# Patient Record
Sex: Male | Born: 1985
Health system: Southern US, Community
[De-identification: ages and names within clinical notes are randomized; demographics above are authoritative.]

## PROBLEM LIST (undated history)

## (undated) DIAGNOSIS — R079 Chest pain, unspecified: Secondary | ICD-10-CM

## (undated) HISTORY — PX: KNEE SURGERY: SHX244

## (undated) HISTORY — DX: Chest pain, unspecified: R07.9

---

## 2017-10-24 ENCOUNTER — Ambulatory Visit (HOSPITAL_BASED_OUTPATIENT_CLINIC_OR_DEPARTMENT_OTHER)
Admission: RE | Admit: 2017-10-24 | Discharge: 2017-10-24 | Disposition: A | Discharge status: Home / Self Care | Payer: BLUE CROSS/BLUE SHIELD | Source: Ambulatory Visit | Attending: Emergency Medicine | Admitting: Emergency Medicine

## 2017-10-24 ENCOUNTER — Encounter (HOSPITAL_BASED_OUTPATIENT_CLINIC_OR_DEPARTMENT_OTHER): Payer: Self-pay

## 2017-10-24 ENCOUNTER — Encounter (HOSPITAL_BASED_OUTPATIENT_CLINIC_OR_DEPARTMENT_OTHER): Payer: Self-pay | Admitting: Emergency Medicine

## 2017-10-24 ENCOUNTER — Ambulatory Visit (HOSPITAL_BASED_OUTPATIENT_CLINIC_OR_DEPARTMENT_OTHER)
Admit: 2017-10-24 | Discharge: 2017-10-24 | Disposition: A | Discharge status: Home / Self Care | Payer: BLUE CROSS/BLUE SHIELD | Attending: Emergency Medicine | Admitting: Emergency Medicine

## 2017-10-24 ENCOUNTER — Emergency Department (HOSPITAL_BASED_OUTPATIENT_CLINIC_OR_DEPARTMENT_OTHER)
Admission: EM | Admit: 2017-10-24 | Discharge: 2017-10-24 | Disposition: A | Discharge status: Home / Self Care | Payer: BLUE CROSS/BLUE SHIELD | Attending: Emergency Medicine | Admitting: Emergency Medicine

## 2017-10-24 ENCOUNTER — Other Ambulatory Visit: Payer: Self-pay

## 2017-10-24 DIAGNOSIS — N503 Cyst of epididymis: Secondary | ICD-10-CM | POA: Insufficient documentation

## 2017-10-24 DIAGNOSIS — N433 Hydrocele, unspecified: Secondary | ICD-10-CM | POA: Insufficient documentation

## 2017-10-24 DIAGNOSIS — I861 Scrotal varices: Secondary | ICD-10-CM

## 2017-10-24 DIAGNOSIS — N50812 Left testicular pain: Secondary | ICD-10-CM | POA: Insufficient documentation

## 2017-10-24 LAB — URINALYSIS, ROUTINE W REFLEX MICROSCOPIC
Bilirubin Urine: NEGATIVE
Glucose, UA: NEGATIVE mg/dL
KETONES UR: NEGATIVE mg/dL
LEUKOCYTES UA: NEGATIVE
NITRITE: NEGATIVE
PROTEIN: NEGATIVE mg/dL
Specific Gravity, Urine: >1.03 — ABNORMAL HIGH (ref 1.005–1.030)
pH: 6 (ref 5.0–8.0)

## 2017-10-24 LAB — URINALYSIS, MICROSCOPIC (REFLEX)

## 2017-10-24 MED ORDER — IBUPROFEN 600 MG PO TABS: 600.0000 mg | ORAL_TABLET | Freq: Four times a day (QID) | ORAL | 0 refills | Status: DC | PRN

## 2017-10-24 MED ORDER — IBUPROFEN 800 MG PO TABS
800.0000 mg | ORAL_TABLET | Freq: Once | ORAL | Status: AC
  Administered 2017-10-24: 800 mg via ORAL
  Filled 2017-10-24: qty 1

## 2017-10-24 NOTE — Discharge Instructions (Signed)
You were seen today for testicle pain.  STD testing is pending.  I suspect you may have epididymitis which is inflammation of the testis.  However, return later today for an ultrasound.  If you develop acute worsening pain or any new or worsening symptoms you should be reevaluated.

## 2017-10-24 NOTE — ED Provider Notes (Signed)
[facility] EMERGENCY DEPARTMENT Provider Note   CSN: [id number] Arrival date & time: 10/24/17  0200     History   Chief Complaint Chief Complaint  Patient presents with  . Testicle Pain    HPI Joseph Flynn is a 32 y.o. male.  HPI  This is a 32 year old male who presents with left testicle pain.  Patient reports pain in the left testicle over the last week.  He reports that it is tender to touch.  No masses noted.  No penile discharge or dysuria noted.  He denies any new sexual partner; however, he does not consistently use condoms.  Additionally he states that he has some left lower back pain the pain does not radiate.  Current pain is 2 out of 10.  He reports that the pain is dull and sometimes comes and goes based on positioning.  Denies any hematuria or history of kidney stones.  History reviewed. No pertinent past medical history.  There are no active problems to display for this patient.   Past Surgical History:  Procedure Laterality Date  . KNEE SURGERY         Home Medications    Prior to Admission medications   Medication Sig Start Date End Date Taking? Authorizing Provider  ibuprofen (ADVIL,MOTRIN) 600 MG tablet Take 1 tablet (600 mg total) by mouth every 6 (six) hours as needed. 10/24/17   Horton, Barbette Hair, MD    Family History No family history on file.  Social History Social History   Tobacco Use  . Smoking status: Never Smoker  . Smokeless tobacco: Never Used  Substance Use Topics  . Alcohol use: Yes    Comment: "frequently up until a couple weeks ago"  . Drug use: No     Allergies   Patient has no known allergies.   Review of Systems Review of Systems  Constitutional: Negative for fever.  Genitourinary: Positive for testicular pain. Negative for discharge, dysuria and scrotal swelling.  Musculoskeletal: Positive for back pain.  All other systems reviewed and are negative.    Physical Exam Updated Vital Signs BP 124/82  (BP Location: Left Arm)   Pulse 79   Temp 98.8 F (37.1 C) (Oral)   Resp 16   Ht 5\' 8"  (1.727 m)   Wt 86.2 kg (190 lb)   SpO2 99%   BMI 28.89 kg/m   Physical Exam  Constitutional: He is oriented to person, place, and time. He appears well-developed and well-nourished. No distress.  HENT:  Head: Normocephalic and atraumatic.  Cardiovascular: Normal rate, regular rhythm and normal heart sounds.  Pulmonary/Chest: Effort normal and breath sounds normal. No respiratory distress. He has no wheezes.  Abdominal: Soft. There is no tenderness.  Genitourinary:  Genitourinary Comments: Normal circumcised penis, testicle without mass noted, no significant tenderness to palpation, normal testicular lie with intact cremasteric reflex bilaterally No CVA tenderness  Musculoskeletal: He exhibits no edema.  Neurological: He is alert and oriented to person, place, and time.  Skin: Skin is warm and dry.  Psychiatric: He has a normal mood and affect.  Nursing note and vitals reviewed.    ED Treatments / Results  Labs (all labs ordered are listed, but only abnormal results are displayed) Labs Reviewed  URINALYSIS, ROUTINE W REFLEX MICROSCOPIC - Abnormal; Notable for the following components:      Result Value   Specific Gravity, Urine >1.030 (*)    Hgb urine dipstick MODERATE (*)    All other components within normal  limits  URINALYSIS, MICROSCOPIC (REFLEX) - Abnormal; Notable for the following components:   Bacteria, UA FEW (*)    Squamous Epithelial / LPF 0-5 (*)    All other components within normal limits  GC/CHLAMYDIA PROBE AMP ([place]) NOT AT [facility]    EKG  EKG Interpretation None       Radiology No results found.  Procedures Procedures (including critical care time)  Medications Ordered in ED Medications  ibuprofen (ADVIL,MOTRIN) tablet 800 mg (800 mg Oral Given 10/24/17 0233)     Initial Impression / Assessment and Plan / ED Course  I have reviewed the triage vital  signs and the nursing notes.  Pertinent labs & imaging results that were available during my care of the patient were reviewed by me and considered in my medical decision making (see chart for details).     Patient presents with testicle and back pain for 1 week.  Does report prior episode probably 3 months ago.  Patient overall nontoxic and his vital signs are reassuring.  His physical exam is benign.  He has no tenderness on my exam but reports tenderness to touch previously.  Consider STD, epididymitis.  Less likely torsion given normal exam and intact cremasteric reflex.  He is not concerned for STDs.  However, STD testing sent.  Urinalysis is largely reassuring.  Patient is very comfortable.  Kidney stone and epididymitis are considerations; however, given how comfortable the patient appears, would elect to treat supportively.  Recommend supportive underwear and ibuprofen as needed for discomfort.  Ultrasound of the testicle was ordered for later today.  Do not feel he needs acute ultrasound at this time as I have low suspicion for acute torsion.  After history, exam, and medical workup I feel the patient has been appropriately medically screened and is safe for discharge home. Pertinent diagnoses were discussed with the patient. Patient was given return precautions.   Final Clinical Impressions(s) / ED Diagnoses   Final diagnoses:  Pain in left testicle    ED Discharge Orders        Ordered    ibuprofen (ADVIL,MOTRIN) 600 MG tablet  Every 6 hours PRN     10/24/17 0248    US PELVIC DOPPLER (TORSION R/O OR MASS ARTERIAL FLOW)     10/24/17 0248    US Scrotum     10/24/17 0248       Horton, Barbette Hair, MD 10/24/17 [phone]

## 2017-10-24 NOTE — ED Triage Notes (Signed)
Reports L testicle pain x ~1 wk ago, with tenderness to touch. Also reports L flank pain. Denies swelling, states " I think it may have gotten a little smaller". Denies dysuria, penile discharge or other sx.

## 2017-10-24 NOTE — ED Provider Notes (Signed)
Patient was seen on 11/14 at 0200 with complaints of left testicular pain that is been ongoing for 1 week.  Low concern for STD.  Patient was seen by provider who ordered outpatient ultrasound of scrotum rule out torsion or any epididymitis.  ED today for ultrasound.  Ultrasound tech gave me report and asked me to update patient on findings.  I spoke with patient concerning the findings.  He does have a cyst arising from both epididymis.  No signs of testicular torsion, inflammatory processes.  He does have a minimal hydrocele on the left and a small varices on the right.  Have given patient the findings and instruct him to follow-up with urology if the symptoms persist.  He is asked about his gonorrhea and chlamydia which I told him are still pending at this time.  Return precautions of any pain worsens, he develops fevers, penile discharge or testicular needed patient verbalized understanding with all plan of care.  Have instructed to follow-up with [facility].  Pt is hemodynamically stable, in NAD, & able to ambulate in the ED. Evaluation does not show pathology that would require ongoing emergent intervention or inpatient treatment. I explained the diagnosis to the patient. Pain has been managed & has no complaints prior to dc. Pt is comfortable with above plan and is stable for discharge at this time. All questions were answered prior to disposition. Strict return precautions for f/u to the ED were discussed. Encouraged follow up with PCP.    Doristine Devoid, PA-C 10/24/17 1036    Duffy Bruce, MD 10/25/17 [phone]

## 2017-10-25 LAB — GC/CHLAMYDIA PROBE AMP (~~LOC~~) NOT AT ARMC
Chlamydia: NEGATIVE
NEISSERIA GONORRHEA: NEGATIVE

## 2020-05-31 ENCOUNTER — Encounter (HOSPITAL_COMMUNITY): Payer: Self-pay

## 2020-05-31 ENCOUNTER — Emergency Department (HOSPITAL_COMMUNITY): Payer: Managed Care, Other (non HMO)

## 2020-05-31 ENCOUNTER — Other Ambulatory Visit: Payer: Self-pay

## 2020-05-31 ENCOUNTER — Emergency Department (HOSPITAL_COMMUNITY)
Admission: EM | Admit: 2020-05-31 | Discharge: 2020-06-01 | Disposition: A | Discharge status: Home / Self Care | Payer: Managed Care, Other (non HMO) | Attending: Emergency Medicine | Admitting: Emergency Medicine

## 2020-05-31 DIAGNOSIS — Z5321 Procedure and treatment not carried out due to patient leaving prior to being seen by health care provider: Secondary | ICD-10-CM | POA: Insufficient documentation

## 2020-05-31 DIAGNOSIS — R0789 Other chest pain: Secondary | ICD-10-CM | POA: Insufficient documentation

## 2020-05-31 LAB — BASIC METABOLIC PANEL
Anion gap: 8 (ref 5–15)
BUN: 7 mg/dL (ref 6–20)
CO2: 28 mmol/L (ref 22–32)
Calcium: 9.7 mg/dL (ref 8.9–10.3)
Chloride: 102 mmol/L (ref 98–111)
Creatinine, Ser: 1.09 mg/dL (ref 0.61–1.24)
GFR calc Af Amer: >60 mL/min (ref >60)
GFR calc non Af Amer: >60 mL/min (ref >60)
Glucose, Bld: 95 mg/dL (ref 70–99)
Potassium: 4.3 mmol/L (ref 3.5–5.1)
Sodium: 138 mmol/L (ref 135–145)

## 2020-05-31 LAB — CBC
HCT: 47.7 % (ref 39.0–52.0)
Hemoglobin: 15.9 g/dL (ref 13.0–17.0)
MCH: 30 pg (ref 26.0–34.0)
MCHC: 33.3 g/dL (ref 30.0–36.0)
MCV: 90 fL (ref 80.0–100.0)
Platelets: 232 10*3/uL (ref 150–400)
RBC: 5.3 MIL/uL (ref 4.22–5.81)
RDW: 12 % (ref 11.5–15.5)
WBC: 5.8 10*3/uL (ref 4.0–10.5)
nRBC: 0 % (ref 0.0–0.2)

## 2020-05-31 LAB — TROPONIN I (HIGH SENSITIVITY)
Troponin I (High Sensitivity): <2 ng/L (ref <18)
Troponin I (High Sensitivity): <2 ng/L (ref <18)

## 2020-05-31 NOTE — ED Triage Notes (Signed)
Patient complains of right sided chest pain since Tuesday, describes as tightness, no cough, no other complaints. States all started following Covid vaccine on Monday

## 2020-06-01 ENCOUNTER — Other Ambulatory Visit: Payer: Self-pay

## 2020-06-01 ENCOUNTER — Emergency Department (HOSPITAL_COMMUNITY)
Admission: EM | Admit: 2020-06-01 | Discharge: 2020-06-02 | Disposition: A | Discharge status: Home / Self Care | Payer: Managed Care, Other (non HMO) | Source: Home / Self Care | Attending: Emergency Medicine | Admitting: Emergency Medicine

## 2020-06-01 ENCOUNTER — Encounter (HOSPITAL_COMMUNITY): Payer: Self-pay | Admitting: Emergency Medicine

## 2020-06-01 ENCOUNTER — Emergency Department (HOSPITAL_COMMUNITY): Payer: Managed Care, Other (non HMO)

## 2020-06-01 DIAGNOSIS — R0789 Other chest pain: Secondary | ICD-10-CM | POA: Insufficient documentation

## 2020-06-01 DIAGNOSIS — R079 Chest pain, unspecified: Secondary | ICD-10-CM

## 2020-06-01 NOTE — ED Notes (Signed)
Pt stated that he saw his lab results and was going to follow up with his pcp.

## 2020-06-01 NOTE — ED Triage Notes (Signed)
Patient reports mid/right chest pain onset Wednesday last week , no SOB , denies emesis or diaphoresis .

## 2020-06-02 LAB — BASIC METABOLIC PANEL
Anion gap: 8 (ref 5–15)
BUN: 12 mg/dL (ref 6–20)
CO2: 27 mmol/L (ref 22–32)
Calcium: 9.5 mg/dL (ref 8.9–10.3)
Chloride: 104 mmol/L (ref 98–111)
Creatinine, Ser: 1.04 mg/dL (ref 0.61–1.24)
GFR calc Af Amer: >60 mL/min (ref >60)
GFR calc non Af Amer: >60 mL/min (ref >60)
Glucose, Bld: 107 mg/dL — ABNORMAL HIGH (ref 70–99)
Potassium: 3.7 mmol/L (ref 3.5–5.1)
Sodium: 139 mmol/L (ref 135–145)

## 2020-06-02 LAB — TROPONIN I (HIGH SENSITIVITY)
Troponin I (High Sensitivity): <2 ng/L (ref <18)
Troponin I (High Sensitivity): 3 ng/L (ref <18)

## 2020-06-02 LAB — PROTIME-INR
INR: 1 (ref 0.8–1.2)
Prothrombin Time: 12.4 seconds (ref 11.4–15.2)

## 2020-06-02 LAB — CBC
HCT: 46.7 % (ref 39.0–52.0)
Hemoglobin: 15.4 g/dL (ref 13.0–17.0)
MCH: 29.6 pg (ref 26.0–34.0)
MCHC: 33 g/dL (ref 30.0–36.0)
MCV: 89.8 fL (ref 80.0–100.0)
Platelets: 227 10*3/uL (ref 150–400)
RBC: 5.2 MIL/uL (ref 4.22–5.81)
RDW: 12.1 % (ref 11.5–15.5)
WBC: 6.2 10*3/uL (ref 4.0–10.5)
nRBC: 0 % (ref 0.0–0.2)

## 2020-06-02 LAB — D-DIMER, QUANTITATIVE: D-Dimer, Quant: <0.27 ug/mL-FEU (ref 0.00–0.50)

## 2020-06-02 NOTE — ED Notes (Signed)
Patient verbalizes understanding of discharge instructions. Opportunity for questioning and answers were provided. Armband removed by staff, pt discharged from ED and ambulated to lobby to return home.   

## 2020-06-02 NOTE — Discharge Instructions (Signed)
If you develop recurrent, continued, or worsening chest pain, shortness of breath, fever, vomiting, abdominal or back pain, or any other new/concerning symptoms then return to the ER for evaluation.  

## 2020-06-02 NOTE — ED Provider Notes (Signed)
[facility] EMERGENCY DEPARTMENT Provider Note   CSN: [id number] Arrival date & time: 06/01/20  2202     History Chief Complaint  Patient presents with  . Chest Pain    Joseph Flynn is a 34 y.o. male.  HPI 34 year old male presents with chest pain.  Started on 8/19.  It is more of a tingling across his chest but he also feels pain in the right side of his chest.  Was pretty constant up until yesterday and now it is better but still present.  No fevers, cough, dyspnea.  Had some back numbness 2 days prior to the chest symptoms and 1 day after receiving his first Covid vaccine.  He did recently travel to and from [place] via car.  He has not noticed any leg swelling.  The pain is mild to moderate right now.  He was originally here on 8/21 and had labs and ECG.  Came back again because he was told his ECG was abnormal by his relative who spoke to a doctor.  History reviewed. No pertinent past medical history.  There are no problems to display for this patient.   Past Surgical History:  Procedure Laterality Date  . KNEE SURGERY Left    x 2 (2013, 2017)       No family history on file.  Social History   Tobacco Use  . Smoking status: Never Smoker  . Smokeless tobacco: Never Used  Vaping Use  . Vaping Use: Never used  Substance Use Topics  . Alcohol use: Yes    Comment: "frequently up until a couple weeks ago"  . Drug use: No    Home Medications Prior to Admission medications   Medication Sig Start Date End Date Taking? Authorizing Provider  ibuprofen (ADVIL) 200 MG tablet Take 200-400 mg by mouth every 6 (six) hours as needed for headache or mild pain.   Yes [provider]  ibuprofen (ADVIL,MOTRIN) 600 MG tablet Take 1 tablet (600 mg total) by mouth every 6 (six) hours as needed. Patient not taking: Reported on 06/02/2020 10/24/17   Horton, Barbette Hair, MD    Allergies    Patient has no known allergies.  Review of Systems   Review of  Systems  Constitutional: Negative for fever.  Respiratory: Negative for cough and shortness of breath.   Cardiovascular: Positive for chest pain.  Gastrointestinal: Negative for abdominal pain.  Musculoskeletal: Negative for back pain.  All other systems reviewed and are negative.   Physical Exam Updated Vital Signs BP 121/72 (BP Location: Right Arm)   Pulse 83   Temp 98.9 F (37.2 C) (Oral)   Resp 14   Ht 5\' 8"  (1.727 m)   Wt 90 kg   SpO2 100%   BMI 30.17 kg/m   Physical Exam Vitals and nursing note reviewed.  Constitutional:      General: He is not in acute distress.    Appearance: He is well-developed. He is not ill-appearing or diaphoretic.  HENT:     Head: Normocephalic and atraumatic.     Right Ear: External ear normal.     Left Ear: External ear normal.     Nose: Nose normal.  Eyes:     General:        Right eye: No discharge.        Left eye: No discharge.  Cardiovascular:     Rate and Rhythm: Normal rate and regular rhythm.     Heart sounds: Normal heart sounds.  Pulmonary:     Effort: Pulmonary effort is normal.     Breath sounds: Normal breath sounds.  Chest:     Chest wall: No tenderness.  Abdominal:     Palpations: Abdomen is soft.     Tenderness: There is no abdominal tenderness.  Musculoskeletal:     Cervical back: Neck supple.  Skin:    General: Skin is warm and dry.  Neurological:     Mental Status: He is alert.  Psychiatric:        Mood and Affect: Mood is not anxious.     ED Results / Procedures / Treatments   Labs (all labs ordered are listed, but only abnormal results are displayed) Labs Reviewed  BASIC METABOLIC PANEL - Abnormal; Notable for the following components:      Result Value   Glucose, Bld 107 (*)    All other components within normal limits  CBC  PROTIME-INR  D-DIMER, QUANTITATIVE (NOT AT [facility])  TROPONIN I (HIGH SENSITIVITY)  TROPONIN I (HIGH SENSITIVITY)    EKG EKG Interpretation  Date/Time:  Sunday June 01 2020 22:22:06 EDT Ventricular Rate:  82 PR Interval:  166 QRS Duration: 88 QT Interval:  346 QTC Calculation: 404 R Axis:   89 Text Interpretation: Normal sinus rhythm T wave abnormality, consider inferolateral ischemia Abnormal ECG T wave changes similar to May 31 2020 Confirmed by Sherwood Gambler [phone]) on 06/02/2020 2:42:26 PM   Radiology DG Chest 2 View  Result Date: 06/01/2020 CLINICAL DATA:  Chest pain EXAM: CHEST - 2 VIEW COMPARISON:  05/31/2020 FINDINGS: The heart size and mediastinal contours are within normal limits. Both lungs are clear. The visualized skeletal structures are unremarkable. IMPRESSION: No active cardiopulmonary disease. Electronically Signed   By: Randa Ngo M.D.   On: 06/01/2020 23:09   DG Chest 2 View  Result Date: 05/31/2020 CLINICAL DATA:  Acute chest pain. EXAM: CHEST - 2 VIEW COMPARISON:  None. FINDINGS: The cardiomediastinal silhouette is unremarkable. There is no evidence of focal airspace disease, pulmonary edema, suspicious pulmonary nodule/mass, pleural effusion, or pneumothorax. No acute bony abnormalities are identified. IMPRESSION: No active cardiopulmonary disease. Electronically Signed   By: Margarette Canada M.D.   On: 05/31/2020 18:50    Procedures Procedures (including critical care time)  Medications Ordered in ED Medications - No data to display  ED Course  I have reviewed the triage vital signs and the nursing notes.  Pertinent labs & imaging results that were available during my care of the patient were reviewed by me and considered in my medical decision making (see chart for details).    MDM Rules/Calculators/A&P                          Patient's ECG, labs, x-ray have been reviewed.  These are overall unremarkable besides the T wave inversion, which I think is benign T wave inversion.  Given recent trip and low level tachycardia on his first visit, D-dimer sent for otherwise low risk chest pain and is negative.  I think PE, ACS,  dissection are quite unlikely.  He is asking for a cardiology referral which will be given.  He will also search out a PCP.  Otherwise, he appears stable for discharge. Final Clinical Impression(s) / ED Diagnoses Final diagnoses:  Nonspecific chest pain    Rx / DC Orders ED Discharge Orders    None       Sherwood Gambler, MD 06/02/20 [phone]

## 2020-07-07 ENCOUNTER — Other Ambulatory Visit: Payer: Self-pay

## 2020-07-07 ENCOUNTER — Ambulatory Visit (INDEPENDENT_AMBULATORY_CARE_PROVIDER_SITE_OTHER): Payer: Managed Care, Other (non HMO) | Admitting: Cardiology

## 2020-07-07 ENCOUNTER — Encounter: Payer: Self-pay | Admitting: Cardiology

## 2020-07-07 VITALS — BP 120/71 | HR 78 | Temp 96.8°F | Ht 68.0 in | Wt 190.8 lb

## 2020-07-07 DIAGNOSIS — R9431 Abnormal electrocardiogram [ECG] [EKG]: Secondary | ICD-10-CM

## 2020-07-07 DIAGNOSIS — Z7189 Other specified counseling: Secondary | ICD-10-CM | POA: Diagnosis not present

## 2020-07-07 DIAGNOSIS — R079 Chest pain, unspecified: Secondary | ICD-10-CM | POA: Diagnosis not present

## 2020-07-07 NOTE — Progress Notes (Signed)
Cardiology Office Note:    Date:  07/07/2020   ID:  Joseph Flynn, DOB [birth year], MRN [id number]  PCP:  Patient, No Pcp Per  Cardiologist:  Buford Dresser, MD  Referring MD: Dr. Regenia Skeeter, ER  CC: new patient consultation for chest discomfort and abnormal ECG  History of Present Illness:    Joseph Flynn is a 34 y.o. male without significant past medical history who is seen as a new consult at the request of Dr. Regenia Skeeter, ER for the evaluation and management of chest pain and abnormal ECG.  Note reviewed from Dr. Regenia Skeeter dated 06/02/20. Reported 4 days of tingling across his chest and right sided chest discomfort. Per note, " He was originally here on 8/21 and had labs and ECG.  Came back again because he was told his ECG was abnormal by his relative who spoke to a doctor." HsTn and d-dimer negative. Patient requested cardiology referral.  Chest pain: -Initial onset: First onset was just after his brother passed away from Covid. First and only time that he has had discomfort. Got his covid shot 8/16, noted "back was on fire" and diffuse tingling sensation for about a week. After that week, settled as tingling on the right side of his chest. -Quality: tinlging -Frequency/duration: constant  -Associated symptoms: none -Aggravating/alleviating factors: none -Prior cardiac history: none -Prior workup: none -Prior treatment: none -Alcohol: none since 11/2019 -Tobacco: never -Comorbidities:  None. -Exercise level: had been jogging 3 miles several times/week -Cardiac ROS: no shortness of breath, no PND, no orthopnea, no LE edema, no syncope -Family history: none that he is aware of. Mother may have had a mini-stroke in 2013.  Does not yet have a primary care doctor.  History reviewed. No pertinent past medical history.  Past Surgical History:  Procedure Laterality Date  . KNEE SURGERY Left    x 2 (2013, 2017)    Current Medications: Current Outpatient Medications on File  Prior to Visit  Medication Sig  . ibuprofen (ADVIL) 200 MG tablet Take 200-400 mg by mouth every 6 (six) hours as needed for headache or mild pain.   No current facility-administered medications on file prior to visit.     Allergies:   Patient has no known allergies.   Social History   Tobacco Use  . Smoking status: Never Smoker  . Smokeless tobacco: Never Used  Vaping Use  . Vaping Use: Never used  Substance Use Topics  . Alcohol use: Yes    Comment: "frequently up until a couple weeks ago"  . Drug use: No    Family History: No history of cardiovascular disease that he is aware of. Mother may have had a mini-stroke in 2013.  ROS:   Please see the history of present illness.  Additional pertinent ROS: Constitutional: Negative for chills, fever, night sweats, unintentional weight loss  HENT: Negative for ear pain and hearing loss.   Eyes: Negative for loss of vision and eye pain.  Respiratory: Negative for cough, sputum, wheezing.   Cardiovascular: See HPI. Gastrointestinal: Negative for abdominal pain, melena, and hematochezia.  Genitourinary: Negative for dysuria and hematuria.  Musculoskeletal: Negative for falls and myalgias.  Skin: Negative for itching and rash.  Neurological: Negative for focal weakness, focal sensory changes and loss of consciousness.  Endo/Heme/Allergies: Does not bruise/bleed easily.     EKGs/Labs/Other Studies Reviewed:    The following studies were reviewed today: No prior  EKG:  EKG is personally reviewed.  The ekg ordered today demonstrates sinus rhythm with sinus  arrhythma, nonspecific T wave pattern  Recent Labs: 06/01/2020: BUN 12; Creatinine, Ser 1.04; Hemoglobin 15.4; Platelets 227; Potassium 3.7; Sodium 139  Recent Lipid Panel No results found for: CHOL, TRIG, HDL, CHOLHDL, VLDL, LDLCALC, LDLDIRECT  Physical Exam:    VS:  BP 120/71   Pulse 78   Temp (!) 96.8 F (36 C)   Ht 5\' 8"  (1.727 m)   Wt 190 lb 12.8 oz (86.5 kg)    SpO2 98%   BMI 29.01 kg/m     Wt Readings from Last 3 Encounters:  07/07/20 190 lb 12.8 oz (86.5 kg)  06/01/20 198 lb 6.6 oz (90 kg)  10/24/17 190 lb (86.2 kg)    GEN: Well nourished, well developed in no acute distress HEENT: Normal, moist mucous membranes NECK: No JVD CARDIAC: regular rhythm, normal S1 and S2, no rubs or gallops. No murmurs. VASCULAR: Radial and DP pulses 2+ bilaterally. No carotid bruits RESPIRATORY:  Clear to auscultation without rales, wheezing or rhonchi  ABDOMEN: Soft, non-tender, non-distended MUSCULOSKELETAL:  Ambulates independently SKIN: Warm and dry, no edema NEUROLOGIC:  Alert and oriented x 3. No focal neuro deficits noted. PSYCHIATRIC:  Normal affect    ASSESSMENT:    1. Chest pain, unspecified type   2. Abnormal ECG   3. Cardiac risk counseling   4. Counseling on health promotion and disease prevention    PLAN:    Chest pain: given symptoms, risk factors, low suspicion for cardiac etiology -negative ER workup -symptoms now resolved -no indication for further testing at this time -counseled on red flag warning signs that need immediate medical attention  Abnormal ECG:  -given age, nonspecific T wave pattern, not an elevated risk pattern  Cardiac risk counseling and prevention recommendations: -recommend heart healthy/Mediterranean diet, with whole grains, fruits, vegetable, fish, lean meats, nuts, and olive oil. Limit salt. -recommend moderate walking, 3-5 times/week for 30-50 minutes each session. Aim for at least 150 minutes.week. Goal should be pace of 3 miles/hours, or walking 1.5 miles in 30 minutes -recommend avoidance of tobacco products. Avoid excess alcohol. -ASCVD risk score: The ASCVD Risk score Mikey Bussing DC Jr., et al., 2013) failed to calculate for the following reasons:   The 2013 ASCVD risk score is only valid for ages 71 to 66    Plan for follow up: as needed. Recommended to establish with primary care  Buford Dresser, MD, PhD [place]  [facility]    Medication Adjustments/Labs and Tests Ordered: Current medicines are reviewed at length with the patient today.  Concerns regarding medicines are outlined above.  Orders Placed This Encounter  Procedures  . EKG 12-Lead   No orders of the defined types were placed in this encounter.   Patient Instructions  Medication Instructions:  Your Physician recommend you continue on your current medication as directed.    *If you need a refill on your cardiac medications before your next appointment, please call your pharmacy*   Lab Work: None ordered    Testing/Procedures: None ordered    Follow-Up: At [facility], you and your health needs are our priority.  As part of our continuing mission to provide you with exceptional heart care, we have created designated Provider Care Teams.  These Care Teams include your primary Cardiologist (physician) and Advanced Practice Providers (APPs -  Physician Assistants and Nurse Practitioners) who all work together to provide you with the care you need, when you need it.  We recommend signing up for the patient portal called "MyChart".  Sign up information is provided on this After Visit Summary.  MyChart is used to connect with patients for Virtual Visits (Telemedicine).  Patients are able to view lab/test results, encounter notes, upcoming appointments, etc.  Non-urgent messages can be sent to your provider as well.   To learn more about what you can do with MyChart, go to [email or web address].    Your next appointment:   As needed  The format for your next appointment:   In Person  Provider:   Buford Dresser, MD       Signed, Buford Dresser, MD PhD 07/07/2020 6:20 PM    [place]

## 2020-07-07 NOTE — Patient Instructions (Signed)
Medication Instructions:  Your Physician recommend you continue on your current medication as directed.    *If you need a refill on your cardiac medications before your next appointment, please call your pharmacy*   Lab Work: None ordered   Testing/Procedures: None ordered    Follow-Up: At [facility], you and your health needs are our priority.  As part of our continuing mission to provide you with exceptional heart care, we have created designated Provider Care Teams.  These Care Teams include your primary Cardiologist (physician) and Advanced Practice Providers (APPs -  Physician Assistants and Nurse Practitioners) who all work together to provide you with the care you need, when you need it.  We recommend signing up for the patient portal called "MyChart".  Sign up information is provided on this After Visit Summary.  MyChart is used to connect with patients for Virtual Visits (Telemedicine).  Patients are able to view lab/test results, encounter notes, upcoming appointments, etc.  Non-urgent messages can be sent to your provider as well.   To learn more about what you can do with MyChart, go to [email or web address].    Your next appointment:   As needed  The format for your next appointment:   In Person  Provider:   Bridgette Christopher, MD    

## 2020-12-23 ENCOUNTER — Other Ambulatory Visit: Payer: Self-pay

## 2020-12-24 ENCOUNTER — Ambulatory Visit (INDEPENDENT_AMBULATORY_CARE_PROVIDER_SITE_OTHER): Payer: Managed Care, Other (non HMO) | Admitting: Medical

## 2020-12-24 ENCOUNTER — Encounter: Payer: Self-pay | Admitting: Medical

## 2020-12-24 ENCOUNTER — Other Ambulatory Visit: Payer: Self-pay

## 2020-12-24 ENCOUNTER — Ambulatory Visit (HOSPITAL_BASED_OUTPATIENT_CLINIC_OR_DEPARTMENT_OTHER)
Admission: RE | Admit: 2020-12-24 | Discharge: 2020-12-24 | Disposition: A | Discharge status: Home / Self Care | Payer: Managed Care, Other (non HMO) | Source: Ambulatory Visit | Attending: Medical | Admitting: Medical

## 2020-12-24 VITALS — BP 138/80 | HR 98 | Temp 98.0°F | Resp 18 | Ht 68.0 in | Wt 194.2 lb

## 2020-12-24 DIAGNOSIS — R002 Palpitations: Secondary | ICD-10-CM

## 2020-12-24 DIAGNOSIS — M5441 Lumbago with sciatica, right side: Secondary | ICD-10-CM | POA: Diagnosis not present

## 2020-12-24 DIAGNOSIS — Z113 Encounter for screening for infections with a predominantly sexual mode of transmission: Secondary | ICD-10-CM | POA: Diagnosis not present

## 2020-12-24 DIAGNOSIS — R1032 Left lower quadrant pain: Secondary | ICD-10-CM

## 2020-12-24 DIAGNOSIS — N503 Cyst of epididymis: Secondary | ICD-10-CM

## 2020-12-24 DIAGNOSIS — Z87898 Personal history of other specified conditions: Secondary | ICD-10-CM | POA: Diagnosis not present

## 2020-12-24 DIAGNOSIS — G8929 Other chronic pain: Secondary | ICD-10-CM | POA: Diagnosis present

## 2020-12-24 NOTE — Progress Notes (Signed)
Subjective:    Patient ID: Joseph Flynn, male    DOB: 03-12-1986, 35 y.o.   MRN: [id number]  HPI  Pt in for first time.  Pt works [employer]. They do software for commercial vehicle diagnostics. Pt does not exercise regularly. Used to work out regularly past summer. Weight and jogging but stopped. No alcohol since feb 2021. Nonsmoker.   No regular meds.  Hx of chest pain in sept 2021.   Chest pain: given symptoms, risk factors, low suspicion for cardiac etiology -negative ER workup -symptoms now resolved -no indication for further testing at this time -counseled on red flag warning signs that need immediate medical attention  Abnormal ECG:  -given age, nonspecific T wave pattern, not an elevated risk pattern  Cardiac risk counseling and prevention recommendations: -recommend heart healthy/Mediterranean diet, with whole grains, fruits, vegetable, fish, lean meats, nuts, and olive oil. Limit salt. -recommend moderate walking, 3-5 times/week for 30-50 minutes each session. Aim for at least 150 minutes.week. Goal should be pace of 3 miles/hours, or walking 1.5 miles in 30 minutes -recommend avoidance of tobacco products. Avoid excess alcohol.   No recent recurrent chest pain as when went to ED. Pt feels like heart might palpitate at time. But also notes correlation of possible symptoms with eating fried foods.   Pt states some lower back pain on and off for multiple years. In august coming back from [place] had sore back pain. A day or so after he got back from trip had radicular rt lower ext pain. Patient still has some back and rt si area pain. No pain shooting to leg. But he states rt 5th toe has felt numb since august when had radicular pain.   Pt also mentions in October after driving long time had cramping left lower quadrant pain. He had diarrhea that day. Over next 2 weeks pain subsided. But still residual faint tender. No family hx of crohn or ulcerative colitis. At one time  back then he thought at times had mucus in stool. Normal bowel movements presently.  Pt wants std screening. He states some faint discomfort in his urethra. States when touched penis last week bottom asepct had pain. No we resolved. No testicle tenderness.  Pt thinks abnormal shape to testicle. In 11-2017 US showed.  IMPRESSION: 1. There is a cyst arising from the head of the epididymis on the right measuring 1.4 x 0.8 x 1.6 cm. There is a rather minimal 4 mm cyst arising from the head of the epididymis on the left.  2. No intratesticular mass on either side. No testicular torsion on either side. No inflammatory focus in either scrotal sac.  3. Rather minimal hydrocele on the left. No hydrocele on the right.  4.  Small varicocele on the right.   Review of Systems  Constitutional: Negative for chills, fatigue and fever.  HENT: Negative for congestion.   Respiratory: Negative for cough, choking, chest tightness, shortness of breath and wheezing.   Cardiovascular: Negative for chest pain and palpitations.  Gastrointestinal: Negative for abdominal pain, constipation, diarrhea and vomiting.  Genitourinary: Negative for difficulty urinating, dysuria, frequency, hematuria, penile pain and urgency.       See hpi.  Musculoskeletal: Negative for back pain, myalgias and neck stiffness.  Skin: Negative for rash.  Neurological: Negative for dizziness and headaches.  Hematological: Negative for adenopathy. Does not bruise/bleed easily.  Psychiatric/Behavioral: Negative for behavioral problems and confusion. The patient is not nervous/anxious.    No past medical history on  file.   Social History   Socioeconomic History  . Marital status: Single    Spouse name: Not on file  . Number of children: Not on file  . Years of education: Not on file  . Highest education level: Not on file  Occupational History  . Not on file  Tobacco Use  . Smoking status: Never Smoker  . Smokeless tobacco:  Never Used  Vaping Use  . Vaping Use: Never used  Substance and Sexual Activity  . Alcohol use: Not Currently  . Drug use: No  . Sexual activity: Not on file  Other Topics Concern  . Not on file  Social History Narrative  . Not on file   Social Determinants of Health   Financial Resource Strain: Not on file  Food Insecurity: Not on file  Transportation Needs: Not on file  Physical Activity: Not on file  Stress: Not on file  Social Connections: Not on file  Intimate Partner Violence: Not on file    Past Surgical History:  Procedure Laterality Date  . KNEE SURGERY Left    x 2 (2013, 2017)    History reviewed. No pertinent family history.  No Known Allergies  Current Outpatient Medications on File Prior to Visit  Medication Sig Dispense Refill  . ibuprofen (ADVIL) 200 MG tablet Take 200-400 mg by mouth every 6 (six) hours as needed for headache or mild pain.     No current facility-administered medications on file prior to visit.    BP 138/80 (BP Location: Left Arm, Patient Position: Sitting, Cuff Size: Normal)   Pulse 98   Temp 98 F (36.7 C) (Oral)   Resp 18   Ht 5\' 8"  (1.727 m)   Wt 194 lb 3.2 oz (88.1 kg)   SpO2 98%   BMI 29.53 kg/m       Objective:   Physical Exam  General Mental Status- Alert. General Appearance- Not in acute distress.   Skin General: Color- Normal Color. Moisture- Normal Moisture.  Neck Carotid Arteries- Normal color. Moisture- Normal Moisture. No carotid bruits. No JVD.  Chest and Lung Exam Auscultation: Breath Sounds:-Normal.  Cardiovascular Auscultation:Rythm- Regular. Murmurs & Other Heart Sounds:Auscultation of the heart reveals- No Murmurs.  Abdomen Inspection:-Inspeection Normal. Palpation/Percussion:Note:No mass. Palpation and Percussion of the abdomen reveal- faint left lower quadrant Tender, Non Distended + BS, no rebound or guarding.    Neurologic Cranial Nerve exam:- CN III-XII intact(No nystagmus),  symmetric smile. Strength:- 5/5 equal and symmetric strength both upper and lower extremities.   Back Mid lumbar spine tenderness to palpation. Pain on straight leg lift. Pain on lateral movements and flexion/extension of the spine.  Lower ext neurologic  L5-S1 sensation intact bilaterally. Normal patellar reflexes bilaterally. No foot drop bilaterally.    Assessment & Plan:  You have history of low back pain since September with some radicular features and right toe numbness sensation.  We will go ahead and get lumbar spine x-ray today.  Pain is not significant presently so not recommending treatment at this time.  If your pain does increase then would recommend ibuprofen 400 mg every 8 hours.  History of intermittent left lower quadrant abdomen pain since the fall.  Minimally present on physical exam today.  Will get CBC, CMP and lipase.  If your pain worsens or changes let us know.  Want to review labs first and then decide on possible referral to GI MD.  History of chest pain in the past with negative work-up per cardiologist.  I did review prior cardiologist note.  Now you are reporting more palpitation type symptoms at times.  We did EKG today.  Ekg machine not working today.  Recommend avoid caffeine beverages or any stimulants.  Will go ahead and refer you to cardiologist. If any cardiac symptoms during interim then advise ED evaluation.  Desire for STD panel screening.  Urine ancillary order placed.  We will also include HIV screen.  History of right side cyst on epididymis.  No pain presently reported.  We will go ahead and refer you to urologist for evaluation and treatment.  If he has any pain in testicles performed appointment with urologist notify me and would reorder ultrasound.  Also considering that you might have some atypical reflux symptoms as you report your palpitation symptoms occur after eating.  Recommend eating healthy diet and could try Pepcid  over-the-counter.   Follow-up in 2 weeks or as needed. Time spent with patient today was   minutes which consisted of chart revdiew, discussing diagnosis, work up treatment and documentation.  Time spent with patient today was 48  minutes which consisted of chart review, discussing diagnoses, work up treatment and documentation.

## 2020-12-24 NOTE — Patient Instructions (Addendum)
You have history of low back pain since September with some radicular features and right toe numbness sensation.  We will go ahead and get lumbar spine x-ray today.  Pain is not significant presently so not recommending treatment at this time.  If your pain does increase then would recommend ibuprofen 400 mg every 8 hours.  History of intermittent left lower quadrant abdomen pain since the fall.  Minimally present on physical exam today.  Will get CBC, CMP and lipase.  If your pain worsens or changes let us know.  Want to review labs first and then decide on possible referral to GI MD.  History of chest pain in the past with negative work-up per cardiologist.  I did review prior cardiologist note.  Now you are reporting more palpitation type symptoms at times.  We did EKG today.  Ekg machine not working today.  Recommend avoid caffeine beverages or any stimulants.  Will go ahead and refer you to cardiologist. If any significant cardiac symptoms during interim then advise ED evaluation.  Desire for STD panel screening.  Urine ancillary order placed.  We will also include HIV screen.  History of right side cyst on epididymis.  No pain presently reported.  We will go ahead and refer you to urologist for evaluation and treatment.  If he has any pain in testicles performed appointment with urologist notify me and would reorder ultrasound.  Also considering that you might have some atypical reflux symptoms as you report your palpitation symptoms occur after eating.  Recommend eating healthy diet and could try Pepcid over-the-counter.   Follow-up in 2 weeks or as needed.

## 2020-12-25 LAB — CBC WITH DIFFERENTIAL/PLATELET
Basophils Absolute: 0.1 10*3/uL (ref 0.0–0.1)
Basophils Relative: 0.9 % (ref 0.0–3.0)
Eosinophils Absolute: 0.1 10*3/uL (ref 0.0–0.7)
Eosinophils Relative: 1.1 % (ref 0.0–5.0)
HCT: 46.9 % (ref 39.0–52.0)
Hemoglobin: 16 g/dL (ref 13.0–17.0)
Lymphocytes Relative: 29 % (ref 12.0–46.0)
Lymphs Abs: 2 10*3/uL (ref 0.7–4.0)
MCHC: 34.2 g/dL (ref 30.0–36.0)
MCV: 88.1 fl (ref 78.0–100.0)
Monocytes Absolute: 0.7 10*3/uL (ref 0.1–1.0)
Monocytes Relative: 10.7 % (ref 3.0–12.0)
Neutro Abs: 4 10*3/uL (ref 1.4–7.7)
Neutrophils Relative %: 58.3 % (ref 43.0–77.0)
Platelets: 247 10*3/uL (ref 150.0–400.0)
RBC: 5.32 Mil/uL (ref 4.22–5.81)
RDW: 13 % (ref 11.5–15.5)
WBC: 6.9 10*3/uL (ref 4.0–10.5)

## 2020-12-25 LAB — COMPREHENSIVE METABOLIC PANEL
ALT: 47 U/L (ref 0–53)
AST: 30 U/L (ref 0–37)
Albumin: 4.5 g/dL (ref 3.5–5.2)
Alkaline Phosphatase: 90 U/L (ref 39–117)
BUN: 12 mg/dL (ref 6–23)
CO2: 31 mEq/L (ref 19–32)
Calcium: 9.7 mg/dL (ref 8.4–10.5)
Chloride: 100 mEq/L (ref 96–112)
Creatinine, Ser: 1.11 mg/dL (ref 0.40–1.50)
GFR: 87.99 mL/min (ref >60.00)
Glucose, Bld: 76 mg/dL (ref 70–99)
Potassium: 4.2 mEq/L (ref 3.5–5.1)
Sodium: 139 mEq/L (ref 135–145)
Total Bilirubin: 0.8 mg/dL (ref 0.2–1.2)
Total Protein: 7.6 g/dL (ref 6.0–8.3)

## 2020-12-25 LAB — LIPASE: Lipase: 42 U/L (ref 11.0–59.0)

## 2020-12-25 LAB — HIV ANTIBODY (ROUTINE TESTING W REFLEX): HIV 1&2 Ab, 4th Generation: NONREACTIVE

## 2021-01-05 ENCOUNTER — Ambulatory Visit (INDEPENDENT_AMBULATORY_CARE_PROVIDER_SITE_OTHER): Payer: Managed Care, Other (non HMO) | Admitting: Medical

## 2021-01-05 ENCOUNTER — Other Ambulatory Visit: Payer: Self-pay

## 2021-01-05 VITALS — BP 116/68 | HR 81 | Resp 20 | Ht 68.0 in | Wt 192.6 lb

## 2021-01-05 DIAGNOSIS — G8929 Other chronic pain: Secondary | ICD-10-CM

## 2021-01-05 DIAGNOSIS — N503 Cyst of epididymis: Secondary | ICD-10-CM

## 2021-01-05 DIAGNOSIS — M5441 Lumbago with sciatica, right side: Secondary | ICD-10-CM | POA: Diagnosis not present

## 2021-01-05 DIAGNOSIS — R002 Palpitations: Secondary | ICD-10-CM

## 2021-01-05 DIAGNOSIS — R1032 Left lower quadrant pain: Secondary | ICD-10-CM

## 2021-01-05 NOTE — Patient Instructions (Signed)
For your history of right sciatic region pain with persisting right fifth toe numbness we will go ahead and refer to sports medicine.  X-ray did show some degenerative changes to the lower spine.  We will see if was meant recommends MRI?  For history of persisting left lower quadrant tenderness on palpation I decided to go ahead and refer you to GI MD.  I think this is worthwhile in light of the fact that pain persists since October.  Lab work was negative.  History of palpitations with appointment to cardiology upcoming soon.  Reminder to avoid caffeine in diet.  History of testicle discomfort but no signs/symptoms presently.  Please keep urology appointment.  Follow-up date to be determined after review of specialist visit notes.

## 2021-01-05 NOTE — Progress Notes (Signed)
Subjective:    Patient ID: Joseph Flynn, male    DOB: 06/29/1986, 35 y.o.   MRN: [id number]  HPI Pt in for follow up. Last A/P below in ".  "You have history of low back pain since September with some radicular features and right toe numbness sensation.  We will go ahead and get lumbar spine x-ray today.  Pain is not significant presently so not recommending treatment at this time.  If your pain does increase then would recommend ibuprofen 400 mg every 8 hours.  History of intermittent left lower quadrant abdomen pain since the fall.  Minimally present on physical exam today.  Will get CBC, CMP and lipase.  If your pain worsens or changes let us know.  Want to review labs first and then decide on possible referral to GI MD.  History of chest pain in the past with negative work-up per cardiologist.  I did review prior cardiologist note.  Now you are reporting more palpitation type symptoms at times.  We did EKG today.  Ekg machine not working today.  Recommend avoid caffeine beverages or any stimulants.  Will go ahead and refer you to cardiologist. If any cardiac symptoms during interim then advise ED evaluation.  Desire for STD panel screening.  Urine ancillary order placed.  We will also include HIV screen.  History of right side cyst on epididymis.  No pain presently reported.  We will go ahead and refer you to urologist for evaluation and treatment.  If he has any pain in testicles performed appointment with urologist notify me and would reorder ultrasound.  Also considering that you might have some atypical reflux symptoms as you report your palpitation symptoms occur after eating.  Recommend eating healthy diet and could try Pepcid over-the-counter."   Pt back pain is not present. Pt did see xray report.  "IMPRESSION: Degenerative disc disease, worst at L5-S1."  No recent pain. Still having numbness to pinkey toe since August.   Regarding pain in abdomen. Still mild tender to  touch in left lower quadrant. Lab work negative initially. Pain only on palpation. No reoccurence with prior features as in Oct.  Pt has been called by cardiology. He has appointment April 7th approximate. Pt states he thought he had light brief plapitation yesterday. No current signs or symptoms. Pt did have coffee yesterday. Reminded him that advise is to avoid.   Pt has testicle discomfort  History and referred to urology. Appoimtment set.  No recent symptoms.   Review of Systems See HPI.    Objective:   Physical Exam  General Mental Status- Alert. General Appearance- Not in acute distress.   Skin General: Color- Normal Color. Moisture- Normal Moisture.  Neck Carotid Arteries- Normal color. Moisture- Normal Moisture. No carotid bruits. No JVD.  Chest and Lung Exam Auscultation: Breath Sounds:-Normal.  Cardiovascular Auscultation:Rythm- Regular. Murmurs & Other Heart Sounds:Auscultation of the heart reveals- No Murmurs.  Abdomen Inspection:-Inspeection Normal. Palpation/Percussion:Note:No mass. Palpation and Percussion of the abdomen reveal-faint mild right lower quadrant tenderness to palpation, Non Distended + BS, no rebound or guarding.    Neurologic Cranial Nerve exam:- CN III-XII intact(No nystagmus), symmetric smile.  Strength:- 5/5 equal and symmetric strength both upper and lower extremities.      Assessment & Plan:  For your history of right sciatic region pain with persisting right fifth toe numbness we will go ahead and refer to sports medicine.  X-ray did show some degenerative changes to the lower spine.  We will see  if was meant recommends MRI?  For history of persisting left lower quadrant tenderness on palpation I decided to go ahead and refer you to GI MD.  I think this is worthwhile in light of the fact that pain persists since October.  Lab work was negative.  History of palpitations with appointment to cardiology upcoming soon.  Reminder to avoid  caffeine in diet.  History of testicle discomfort but no signs/symptoms presently.  Please keep urology appointment.  Follow-up date to be determined after review of specialist visit notes.

## 2021-01-05 NOTE — Addendum Note (Signed)
Addended by: Anabel Halon on: 01/05/2021 10:52 AM   Modules accepted: Orders

## 2021-01-20 ENCOUNTER — Encounter: Payer: Self-pay | Admitting: Physician Assistant

## 2021-01-20 ENCOUNTER — Ambulatory Visit: Payer: Managed Care, Other (non HMO) | Admitting: Physician Assistant

## 2021-01-20 NOTE — Progress Notes (Signed)
Cardiology Office Note    Date:  01/22/2021   ID:  Joseph Flynn, DOB April 07, 1986, MRN [id number]  PCP:  Mackie Pai, PA-C  Cardiologist:  Buford Dresser, MD  Electrophysiologist:  None   Chief Complaint: palpitations  History of Present Illness:   Joseph Flynn is a 35 y.o. male with history of prior knee surgery who presents for evaluation of palpitations. He previously saw Dr. Harrell Gave in 06/2020 for atypical chest pain. This occurred around the context of his brother having recently passed away from Covid, and he himself had gotten his recent Covid vaccine. It felt like a "shock like" pain. Some tingling noted per notes as well. He had had negative ER workup and no red flag symptoms. His EKG was slightly abnormal but was not felt to require further cardiac testing at that time. This symptom eventually resolved.  About a month ago he began noticing intermittent palpitations. The first week they occurred it happened 4-5x in a week. Over the last 2 weeks it has happened only 3-4x. It can last up to hours at a time. Did not necessarily check VS during events. No specific associated symptoms reported, just a hard sensation of his heart pounding. This seems aggravated by intake of increased processed sugar. Over the last few weeks he decreased his caffeine intake (coffee, Red Bull) down but still has occasional energy drink. No tobacco or illicit drug use. Reports about 4 ETOH drinks a week whereas he was previously not drinking at all. He is able to jog regularly and feels well doing so without angina or dyspnea. Exercise does not bring on the palpitations. They mostly occur at rest or at night. No history of syncope.   Labwork independently reviewed: 12/2020 lipase wnl, CMET wnl (K 4.2), CBC wnl   Past Medical History:  Diagnosis Date  . Chest pain    negative ER workup 05/2020    Past Surgical History:  Procedure Laterality Date  . KNEE SURGERY Left    x 2 (2013, 2017)     Current Medications: No outpatient medications have been marked as taking for the 01/22/21 encounter (Office Visit) with Charlie Pitter, PA-C.   (None)   Allergies:   Patient has no known allergies.   Social History   Socioeconomic History  . Marital status: Single    Spouse name: Not on file  . Number of children: Not on file  . Years of education: Not on file  . Highest education level: Not on file  Occupational History  . Not on file  Tobacco Use  . Smoking status: Never Smoker  . Smokeless tobacco: Never Used  Vaping Use  . Vaping Use: Never used  Substance and Sexual Activity  . Alcohol use: Not Currently  . Drug use: No  . Sexual activity: Yes  Other Topics Concern  . Not on file  Social History Narrative  . Not on file   Social Determinants of Health   Financial Resource Strain: Not on file  Food Insecurity: Not on file  Transportation Needs: Not on file  Physical Activity: Not on file  Stress: Not on file  Social Connections: Not on file     Family History:  The patient's family history includes Transient ischemic attack in his mother. There is no history of CAD.  ROS:   Please see the history of present illness.  All other systems are reviewed and otherwise negative.    EKGs/Labs/Other Studies Reviewed:    Studies reviewed are outlined  and summarized above. Reports included below if pertinent.  N/a    EKG:  EKG is ordered today, personally reviewed, demonstrating NSR 73bpm, nonspecific TW changes inferiorly and V4-V6, similar variable changes seen in prior tracings  Recent Labs: 12/24/2020: ALT 47; BUN 12; Creatinine, Ser 1.11; Hemoglobin 16.0; Platelets 247.0; Potassium 4.2; Sodium 139  Recent Lipid Panel No results found for: CHOL, TRIG, HDL, CHOLHDL, VLDL, LDLCALC, LDLDIRECT  PHYSICAL EXAM:    VS:  BP 112/74   Pulse 73   Ht 5\' 8"  (1.727 m)   Wt 195 lb 12.8 oz (88.8 kg)   SpO2 97%   BMI 29.77 kg/m   BMI: Body mass index is 29.77  kg/m.  GEN: Well nourished, well developed male in no acute distress HEENT: normocephalic, atraumatic Neck: no JVD, carotid bruits, or masses Cardiac: RRR; no murmurs, rubs, or gallops, no edema  Respiratory:  clear to auscultation bilaterally, normal work of breathing GI: soft, nontender, nondistended, + BS MS: no deformity or atrophy Skin: warm and dry, no rash Neuro:  Alert and Oriented x 3, Strength and sensation are intact, follows commands Psych: euthymic mood, full affect  Wt Readings from Last 3 Encounters:  01/22/21 195 lb 12.8 oz (88.8 kg)  01/05/21 192 lb 9.6 oz (87.4 kg)  12/24/20 194 lb 3.2 oz (88.1 kg)     ASSESSMENT & PLAN:   1. Palpitations - labs reviewed from PCP, potassium normal. Will get TSH and Mg. Will order 2 week Zio to further evaluate etiology. Will arrange 2D echocardiogram as well. Discussed avoidance of energy drinks and AHA recommendation regarding ETOH intake. Hold off on empiric rx until we see what palpitations represent. Also discussed use of Kardia or Apple watch EKG feature as an option for future monitoring.  2. History of chest pain - agree symptoms did not sound ischemic at that time. Cardiac markers were also negative ruling out myocarditis. He is able to jog without any angina.  3. Abnormal EKG - may be patient's normal variant but given development of palpitations, will plan 2D echocardiogram for completeness.  Disposition: F/u with me in 2 months.    Medication Adjustments/Labs and Tests Ordered: Current medicines are reviewed at length with the patient today.  Concerns regarding medicines are outlined above. Medication changes, Labs and Tests ordered today are summarized above and listed in the Patient Instructions accessible in Encounters.   Signed, Charlie Pitter, PA-C  01/22/2021 9:29 AM    [address], [place], [place]  [address] Phone: [phone]; Fax: [phone]

## 2021-01-22 ENCOUNTER — Encounter: Payer: Self-pay | Admitting: Radiology

## 2021-01-22 ENCOUNTER — Ambulatory Visit (INDEPENDENT_AMBULATORY_CARE_PROVIDER_SITE_OTHER): Payer: Managed Care, Other (non HMO) | Admitting: Physician Assistant

## 2021-01-22 ENCOUNTER — Ambulatory Visit (INDEPENDENT_AMBULATORY_CARE_PROVIDER_SITE_OTHER): Payer: Managed Care, Other (non HMO)

## 2021-01-22 ENCOUNTER — Encounter: Payer: Self-pay | Admitting: Physician Assistant

## 2021-01-22 ENCOUNTER — Other Ambulatory Visit: Payer: Self-pay

## 2021-01-22 VITALS — BP 112/74 | HR 73 | Ht 68.0 in | Wt 195.8 lb

## 2021-01-22 DIAGNOSIS — R9431 Abnormal electrocardiogram [ECG] [EKG]: Secondary | ICD-10-CM | POA: Diagnosis not present

## 2021-01-22 DIAGNOSIS — R002 Palpitations: Secondary | ICD-10-CM

## 2021-01-22 DIAGNOSIS — Z87898 Personal history of other specified conditions: Secondary | ICD-10-CM | POA: Diagnosis not present

## 2021-01-22 LAB — TSH: TSH: 1.37 u[IU]/mL (ref 0.450–4.500)

## 2021-01-22 LAB — MAGNESIUM: Magnesium: 2.1 mg/dL (ref 1.6–2.3)

## 2021-01-22 NOTE — Patient Instructions (Addendum)
Medication Instructions:  Your physician recommends that you continue on your current medications as directed. Please refer to the Current Medication list given to you today.  *If you need a refill on your cardiac medications before your next appointment, please call your pharmacy*   Lab Work: TODAY:  MAG & TSH   If you have labs (blood work) drawn today and your tests are completely normal, you will receive your results only by: Marland Kitchen MyChart Message (if you have MyChart) OR . A paper copy in the mail If you have any lab test that is abnormal or we need to change your treatment, we will call you to review the results.   Testing/Procedures:  Your physician has requested that you have an echocardiogram. Echocardiography is a painless test that uses sound waves to create images of your heart. It provides your doctor with information about the size and shape of your heart and how well your heart's chambers and valves are working. This procedure takes approximately one hour. There are no restrictions for this procedure.   ZIO XT- Long Term Monitor Instructions   Your physician has requested you wear a ZIO patch monitor for 14 days.  This is a single patch monitor.   [employer] supplies one patch monitor per enrollment. Additional stickers are not available. Please do not apply patch if you will be having a Nuclear Stress Test, Echocardiogram, Cardiac CT, MRI, or Chest Xray during the period you would be wearing the monitor. The patch cannot be worn during these tests. You cannot remove and re-apply the ZIO XT patch monitor.  Your ZIO patch monitor will be sent Fed Ex from [employer] directly to your home address. It may take 3-5 days to receive your monitor after you have been enrolled.  Once you have received your monitor, please review the enclosed instructions. Your monitor has already been registered assigning a specific monitor serial # to you.  Billing and Patient Assistance Program  Information   We have supplied [employer] with any of your insurance information on file for billing purposes. [employer] offers a sliding scale Patient Assistance Program for patients that do not have insurance, or whose insurance does not completely cover the cost of the ZIO monitor.   You must apply for the Patient Assistance Program to qualify for this discounted rate.     To apply, please call [employer] at [phone], select option 4, then select option 2, and ask to apply for Patient Assistance Program.  Theodore Demark will ask your household income, and how many people are in your household.  They will quote your out-of-pocket cost based on that information.  [employer] will also be able to set up a 61-month, interest-free payment plan if needed.  Applying the monitor   Shave hair from upper left chest.  Hold abrader disc by orange tab. Rub abrader in 40 strokes over the upper left chest as indicated in your monitor instructions.  Clean area with 4 enclosed alcohol pads. Let dry.  Apply patch as indicated in monitor instructions. Patch will be placed under collarbone on left side of chest with arrow pointing upward.  Rub patch adhesive wings for 2 minutes. Remove white label marked "1". Remove the white label marked "2". Rub patch adhesive wings for 2 additional minutes.  While looking in a mirror, press and release button in center of patch. A small green light will flash 3-4 times. This will be your only indicator that the monitor has been turned on. ?  Do  not shower for the first 24 hours. You may shower after the first 24 hours.  Press the button if you feel a symptom. You will hear a small click. Record Date, Time and Symptom in the Patient Logbook.  When you are ready to remove the patch, follow instructions on the last 2 pages of the Patient Logbook. Stick patch monitor onto the last page of Patient Logbook.  Place Patient Logbook in the blue and white box.  Use locking tab on box and tape box closed  securely.  The blue and white box has prepaid postage on it. Please place it in the mailbox as soon as possible. Your physician should have your test results approximately 7 days after the monitor has been mailed back to [facility].  Call [place] at [phone] if you have questions regarding your ZIO XT patch monitor. Call them immediately if you see an orange light blinking on your monitor.  If your monitor falls off in less than 4 days, contact our Monitor department at [phone]. ?If your monitor becomes loose or falls off after 4 days call [employer] at [phone] for suggestions on securing your monitor.?    Follow-Up: At [facility], you and your health needs are our priority.  As part of our continuing mission to provide you with exceptional heart care, we have created designated Provider Care Teams.  These Care Teams include your primary Cardiologist (physician) and Advanced Practice Providers (APPs -  Physician Assistants and Nurse Practitioners) who all work together to provide you with the care you need, when you need it.  We recommend signing up for the patient portal called "MyChart".  Sign up information is provided on this After Visit Summary.  MyChart is used to connect with patients for Virtual Visits (Telemedicine).  Patients are able to view lab/test results, encounter notes, upcoming appointments, etc.  Non-urgent messages can be sent to your provider as well.   To learn more about what you can do with MyChart, go to [email or web address].    Your next appointment:   2 MONTHS  The format for your next appointment:   In Person  Provider:    You may see Melina Copa, PA-C    Other Instructions   Echocardiogram An echocardiogram is a test that uses sound waves (ultrasound) to produce images of the heart. Images from an echocardiogram can provide important information about:  Heart size and shape.  The size and thickness and movement  of your heart's walls.  Heart muscle function and strength.  Heart valve function or if you have stenosis. Stenosis is when the heart valves are too narrow.  If blood is flowing backward through the heart valves (regurgitation).  A tumor or infectious growth around the heart valves.  Areas of heart muscle that are not working well because of poor blood flow or injury from a heart attack.  Aneurysm detection. An aneurysm is a weak or damaged part of an artery wall. The wall bulges out from the normal force of blood pumping through the body. Tell a health care provider about:  Any allergies you have.  All medicines you are taking, including vitamins, herbs, eye drops, creams, and over-the-counter medicines.  Any blood disorders you have.  Any surgeries you have had.  Any medical conditions you have.  Whether you are pregnant or may be pregnant. What are the risks? Generally, this is a safe test. However, problems may occur, including an allergic reaction to dye (contrast) that may be  used during the test. What happens before the test? No specific preparation is needed. You may eat and drink normally. What happens during the test?  You will take off your clothes from the waist up and put on a hospital gown.  Electrodes or electrocardiogram (ECG)patches may be placed on your chest. The electrodes or patches are then connected to a device that monitors your heart rate and rhythm.  You will lie down on a table for an ultrasound exam. A gel will be applied to your chest to help sound waves pass through your skin.  A handheld device, called a transducer, will be pressed against your chest and moved over your heart. The transducer produces sound waves that travel to your heart and bounce back (or "echo" back) to the transducer. These sound waves will be captured in real-time and changed into images of your heart that can be viewed on a video monitor. The images will be recorded on a  computer and reviewed by your health care provider.  You may be asked to change positions or hold your breath for a short time. This makes it easier to get different views or better views of your heart.  In some cases, you may receive contrast through an IV in one of your veins. This can improve the quality of the pictures from your heart. The procedure may vary among health care providers and hospitals.   What can I expect after the test? You may return to your normal, everyday life, including diet, activities, and medicines, unless your health care provider tells you not to do that. Follow these instructions at home:  It is up to you to get the results of your test. Ask your health care provider, or the department that is doing the test, when your results will be ready.  Keep all follow-up visits. This is important. Summary  An echocardiogram is a test that uses sound waves (ultrasound) to produce images of the heart.  Images from an echocardiogram can provide important information about the size and shape of your heart, heart muscle function, heart valve function, and other possible heart problems.  You do not need to do anything to prepare before this test. You may eat and drink normally.  After the echocardiogram is completed, you may return to your normal, everyday life, unless your health care provider tells you not to do that. This information is not intended to replace advice given to you by your health care provider. Make sure you discuss any questions you have with your health care provider. Document Revised: 05/20/2020 Document Reviewed: 05/20/2020 Elsevier Patient Education  2021 [employer].

## 2021-01-22 NOTE — Progress Notes (Signed)
Enrolled patient for a 14 day Zio XT Monitor to be mailed to patients home  

## 2021-01-28 DIAGNOSIS — R9431 Abnormal electrocardiogram [ECG] [EKG]: Secondary | ICD-10-CM

## 2021-01-28 DIAGNOSIS — Z87898 Personal history of other specified conditions: Secondary | ICD-10-CM | POA: Diagnosis not present

## 2021-01-28 DIAGNOSIS — R002 Palpitations: Secondary | ICD-10-CM

## 2021-02-13 ENCOUNTER — Encounter: Payer: Self-pay | Admitting: Gastroenterology

## 2021-02-26 ENCOUNTER — Other Ambulatory Visit: Payer: Self-pay

## 2021-02-26 ENCOUNTER — Ambulatory Visit (HOSPITAL_COMMUNITY): Payer: BC Managed Care – PPO | Attending: Cardiology

## 2021-02-26 DIAGNOSIS — Z87898 Personal history of other specified conditions: Secondary | ICD-10-CM | POA: Diagnosis not present

## 2021-02-26 DIAGNOSIS — R9431 Abnormal electrocardiogram [ECG] [EKG]: Secondary | ICD-10-CM | POA: Diagnosis not present

## 2021-02-26 DIAGNOSIS — R002 Palpitations: Secondary | ICD-10-CM | POA: Diagnosis not present

## 2021-02-26 LAB — ECHOCARDIOGRAM COMPLETE
Area-P 1/2: 4.39 cm2
S' Lateral: 3.1 cm

## 2021-03-11 ENCOUNTER — Ambulatory Visit (INDEPENDENT_AMBULATORY_CARE_PROVIDER_SITE_OTHER): Payer: BC Managed Care – PPO | Admitting: Gastroenterology

## 2021-03-11 ENCOUNTER — Encounter: Payer: Self-pay | Admitting: Gastroenterology

## 2021-03-11 VITALS — BP 118/82 | HR 66 | Ht 68.0 in | Wt 198.0 lb

## 2021-03-11 DIAGNOSIS — R194 Change in bowel habit: Secondary | ICD-10-CM | POA: Diagnosis not present

## 2021-03-11 DIAGNOSIS — R1032 Left lower quadrant pain: Secondary | ICD-10-CM

## 2021-03-11 MED ORDER — NA SULFATE-K SULFATE-MG SULF 17.5-3.13-1.6 GM/177ML PO SOLN: 1.0000 | Freq: Once | ORAL | 0 refills | Status: AC

## 2021-03-11 NOTE — Progress Notes (Signed)
     03/11/2021 Joseph Flynn [id number] [birth year]   HISTORY OF PRESENT ILLNESS: This is a 35 year old male who is new to our office.  He has been referred here by his PCP, Mackie Pai, PA-C, for evaluation regarding left lower quadrant abdominal pain.  He is a somewhat limited historian on details of his symptoms, but states that back in October he developed pain in his left lower quadrant.  This was followed by diarrhea.  Since that time the pain has persisted to a degree and is always tender or sore to the touch in that area.  He has had diarrhea on and off, says usually a few days out of the week, but diarrhea only occurs once or twice a day.  He denies seeing blood in his stools.  He says that prior to this he would only get occasional intermittent diarrhea.  He denies any other associated symptoms.  Recent CBC, CMP, lipase, TSH were within normal limits.   Past Medical History:  Diagnosis Date  . Chest pain    negative ER workup 05/2020   Past Surgical History:  Procedure Laterality Date  . KNEE SURGERY Left    x 2 (2013, 2017)    reports that he has never smoked. He has never used smokeless tobacco. He reports current alcohol use of about 7.0 standard drinks of alcohol per week. He reports that he does not use drugs. family history includes Diabetes in his mother; Transient ischemic attack in his mother. No Known Allergies    No outpatient encounter medications on file as of 03/11/2021.   No facility-administered encounter medications on file as of 03/11/2021.     REVIEW OF SYSTEMS  : All other systems reviewed and negative except where noted in the History of Present Illness.   PHYSICAL EXAM: BP 118/82   Pulse 66   Ht 5\' 8"  (1.727 m)   Wt 198 lb (89.8 kg)   BMI 30.11 kg/m  General: Well developed AA male in no acute distress Head: Normocephalic and atraumatic Eyes:  Sclerae anicteric, conjunctiva pink. Ears: Normal auditory acuity Lungs: Clear throughout to  auscultation; no W/R/R. Heart: Regular rate and rhythm; no M/R/G. Abdomen: Soft, non-distended.  BS present.  Minimal LLQ TTP. Rectal:  Will be done at the time of colonoscopy. Musculoskeletal: Symmetrical with no gross deformities  Skin: No lesions on visible extremities Extremities: No edema  Neurological: Alert oriented x 4, grossly non-focal Psychological:  Alert and cooperative. Normal mood and affect  ASSESSMENT AND PLAN: *35 year old male with complaints of left lower quadrant abdominal pain and change in bowel habits with loose stool/diarrhea since October.  Had an episode of left lower quadrant abdominal pain in October and has remained tender/sore in that area since that time.  Has diarrhea a few days out of the week.  Denies any rectal bleeding.  Question if this is just IBS related.  ? IBD.  We will plan for colonoscopy with Dr. Fuller Plan.  The risks, benefits, and alternatives were discussed with the patient and he consents to proceed.  If negative then trial of antispasmodic.   CC:  Saguier, Percell Miller, PA-C

## 2021-03-11 NOTE — Patient Instructions (Signed)
If you are age 33 or older, your body mass index should be between 23-30. Your Body mass index is 30.11 kg/m. If this is out of the aforementioned range listed, please consider follow up with your Primary Care Provider.  If you are age 79 or younger, your body mass index should be between 19-25. Your Body mass index is 30.11 kg/m. If this is out of the aformentioned range listed, please consider follow up with your Primary Care Provider.   You have been scheduled for a colonoscopy. Please follow written instructions given to you at your visit today.  Please pick up your prep supplies at the pharmacy within the next 1-3 days. If you use inhalers (even only as needed), please bring them with you on the day of your procedure.  Due to recent changes in healthcare laws, you may see the results of your imaging and laboratory studies on MyChart before your provider has had a chance to review them.  We understand that in some cases there may be results that are confusing or concerning to you. Not all laboratory results come back in the same time frame and the provider may be waiting for multiple results in order to interpret others.  Please give [place] 48 hours in order for your provider to thoroughly review all the results before contacting the office for clarification of your results.   The [facility] providers would like to encourage you to use [facility] to communicate with providers for non-urgent requests or questions.  Due to long hold times on the telephone, sending your provider a message by [facility] may be a faster and more efficient way to get a response.  Please allow 48 business hours for a response.  Please remember that this is for non-urgent requests.

## 2021-03-13 NOTE — Progress Notes (Signed)
Reviewed and agree with management plan.  Rosaura Bolon T. Zarius Furr, MD FACG [phone]  

## 2021-03-25 ENCOUNTER — Ambulatory Visit: Payer: Managed Care, Other (non HMO) | Admitting: Physician Assistant

## 2021-03-27 ENCOUNTER — Ambulatory Visit (INDEPENDENT_AMBULATORY_CARE_PROVIDER_SITE_OTHER): Payer: BC Managed Care – PPO | Admitting: Family Medicine

## 2021-03-27 ENCOUNTER — Encounter: Payer: Self-pay | Admitting: Family Medicine

## 2021-03-27 ENCOUNTER — Other Ambulatory Visit: Payer: Self-pay

## 2021-03-27 VITALS — BP 124/82 | Ht 68.0 in | Wt 198.0 lb

## 2021-03-27 DIAGNOSIS — M5441 Lumbago with sciatica, right side: Secondary | ICD-10-CM

## 2021-03-27 NOTE — Progress Notes (Signed)
  Joseph Flynn - 35 y.o. male MRN [id number]  Date of birth: January 10, 1986  SUBJECTIVE:  Including CC & ROS.  No chief complaint on file.   Joseph Flynn is a 36 y.o. male that is presenting with acute on chronic low back pain.  He also gets radicular pain down the right leg intermittently.  Denies any history of surgery.  Seems to be worse with prolonged sitting..  Independent review of the lumbar spine x-ray from 3/16 shows degenerative disc disease L5-S1.   Review of Systems See HPI   HISTORY: Past Medical, Surgical, Social, and Family History Reviewed & Updated per EMR.   Pertinent Historical Findings include:  Past Medical History:  Diagnosis Date   Chest pain    negative ER workup 05/2020    Past Surgical History:  Procedure Laterality Date   KNEE SURGERY Left    x 2 (2013, 2017)    Family History  Problem Relation Age of Onset   Transient ischemic attack Mother    Diabetes Mother    CAD Neg Hx     Social History   Socioeconomic History   Marital status: Single    Spouse name: Not on file   Number of children: Not on file   Years of education: Not on file   Highest education level: Not on file  Occupational History   Not on file  Tobacco Use   Smoking status: Never   Smokeless tobacco: Never  Vaping Use   Vaping Use: Never used  Substance and Sexual Activity   Alcohol use: Yes    Alcohol/week: 7.0 standard drinks    Types: 7 Shots of liquor per week   Drug use: No   Sexual activity: Yes  Other Topics Concern   Not on file  Social History Narrative   Not on file   Social Determinants of Health   Financial Resource Strain: Not on file  Food Insecurity: Not on file  Transportation Needs: Not on file  Physical Activity: Not on file  Stress: Not on file  Social Connections: Not on file  Intimate Partner Violence: Not on file     PHYSICAL EXAM:  VS: BP 124/82 (BP Location: Left Arm, Patient Position: Sitting, Cuff Size: Large)   Ht 5\' 8"  (1.727 m)    Wt 198 lb (89.8 kg)   BMI 30.11 kg/m  Physical Exam Gen: NAD, alert, cooperative with exam, well-appearing MSK:  Back:  Normal flexion and extension. Normal strength resistance. Good stability with 1 leg standing and hip flexion. Neurovascular intact    ASSESSMENT & PLAN:   Acute right-sided low back pain with right-sided sciatica Mild degenerative changes in the disc at L5-S1 and radicular pain in the right lower leg. -Counseled on home exercise therapy and supportive care. -Pursue physical therapy.

## 2021-03-27 NOTE — Patient Instructions (Signed)
Nice to meet you Please try heat  Please try the exercises  Please let me know when you would like to try physical therapy   Please send me a message in MyChart with any questions or updates.  Please see [place] back as needed.   --Dr. Raeford Razor

## 2021-03-27 NOTE — Assessment & Plan Note (Signed)
Mild degenerative changes in the disc at L5-S1 and radicular pain in the right lower leg. -Counseled on home exercise therapy and supportive care. -Pursue physical therapy.

## 2021-04-01 ENCOUNTER — Other Ambulatory Visit: Payer: Self-pay

## 2021-04-01 ENCOUNTER — Ambulatory Visit (AMBULATORY_SURGERY_CENTER): Payer: BC Managed Care – PPO | Admitting: Gastroenterology

## 2021-04-01 ENCOUNTER — Other Ambulatory Visit: Payer: Self-pay | Admitting: Gastroenterology

## 2021-04-01 ENCOUNTER — Encounter: Payer: Self-pay | Admitting: Gastroenterology

## 2021-04-01 VITALS — BP 109/70 | HR 76 | Temp 98.0°F | Resp 21 | Ht 68.0 in | Wt 198.0 lb

## 2021-04-01 DIAGNOSIS — K5730 Diverticulosis of large intestine without perforation or abscess without bleeding: Secondary | ICD-10-CM

## 2021-04-01 DIAGNOSIS — R194 Change in bowel habit: Secondary | ICD-10-CM | POA: Diagnosis present

## 2021-04-01 DIAGNOSIS — K640 First degree hemorrhoids: Secondary | ICD-10-CM | POA: Diagnosis not present

## 2021-04-01 DIAGNOSIS — K635 Polyp of colon: Secondary | ICD-10-CM

## 2021-04-01 DIAGNOSIS — R1032 Left lower quadrant pain: Secondary | ICD-10-CM | POA: Diagnosis not present

## 2021-04-01 DIAGNOSIS — R197 Diarrhea, unspecified: Secondary | ICD-10-CM

## 2021-04-01 DIAGNOSIS — D125 Benign neoplasm of sigmoid colon: Secondary | ICD-10-CM

## 2021-04-01 MED ORDER — SODIUM CHLORIDE 0.9 % IV SOLN: 500.0000 mL | INTRAVENOUS | Status: DC

## 2021-04-01 MED ORDER — GLYCOPYRROLATE 2 MG PO TABS: 2.0000 mg | ORAL_TABLET | Freq: Two times a day (BID) | ORAL | 3 refills | Status: AC

## 2021-04-01 NOTE — Progress Notes (Signed)
Called to room to assist during endoscopic procedure.  Patient ID and intended procedure confirmed with present staff. Received instructions for my participation in the procedure from the performing physician.  

## 2021-04-01 NOTE — Progress Notes (Signed)
Vs by CW 

## 2021-04-01 NOTE — Patient Instructions (Signed)
YOU HAD AN ENDOSCOPIC PROCEDURE TODAY AT THE [facility]:   Refer to the procedure report that was given to you for any specific questions about what was found during the examination.  If the procedure report does not answer your questions, please call your gastroenterologist to clarify.  If you requested that your care partner not be given the details of your procedure findings, then the procedure report has been included in a sealed envelope for you to review at your convenience later.  YOU SHOULD EXPECT: Some feelings of bloating in the abdomen. Passage of more gas than usual.  Walking can help get rid of the air that was put into your GI tract during the procedure and reduce the bloating. If you had a lower endoscopy (such as a colonoscopy or flexible sigmoidoscopy) you may notice spotting of blood in your stool or on the toilet paper. If you underwent a bowel prep for your procedure, you may not have a normal bowel movement for a few days.  Please Note:  You might notice some irritation and congestion in your nose or some drainage.  This is from the oxygen used during your procedure.  There is no need for concern and it should clear up in a day or so.  SYMPTOMS TO REPORT IMMEDIATELY:   Following lower endoscopy (colonoscopy or flexible sigmoidoscopy):  Excessive amounts of blood in the stool  Significant tenderness or worsening of abdominal pains  Swelling of the abdomen that is new, acute  Fever of 100F or higher   Following upper endoscopy (EGD)  Vomiting of blood or coffee ground material  New chest pain or pain under the shoulder blades  Painful or persistently difficult swallowing  New shortness of breath  Fever of 100F or higher  Black, tarry-looking stools  For urgent or emergent issues, a gastroenterologist can be reached at any hour by calling [phone]. Do not use MyChart messaging for urgent concerns.    DIET:  We do recommend a small meal at first, but  then you may proceed to your regular diet.  Drink plenty of fluids but you should avoid alcoholic beverages for 24 hours.  ACTIVITY:  You should plan to take it easy for the rest of today and you should NOT DRIVE or use heavy machinery until tomorrow (because of the sedation medicines used during the test).    FOLLOW UP: Our staff will call the number listed on your records 48-72 hours following your procedure to check on you and address any questions or concerns that you may have regarding the information given to you following your procedure. If we do not reach you, we will leave a message.  We will attempt to reach you two times.  During this call, we will ask if you have developed any symptoms of COVID 19. If you develop any symptoms (ie: fever, flu-like symptoms, shortness of breath, cough etc.) before then, please call [phone].  If you test positive for Covid 19 in the 2 weeks post procedure, please call and report this information to us.    If any biopsies were taken you will be contacted by phone or by letter within the next 1-3 weeks.  Please call us at [phone] if you have not heard about the biopsies in 3 weeks.    SIGNATURES/CONFIDENTIALITY: You and/or your care partner have signed paperwork which will be entered into your electronic medical record.  These signatures attest to the fact that that the information above on   your After Visit Summary has been reviewed and is understood.  Full responsibility of the confidentiality of this discharge information lies with you and/or your care-partner. 

## 2021-04-01 NOTE — Op Note (Signed)
[name] Name: Joseph Flynn Procedure Date: 04/01/2021 8:30 AM MRN: [id number] Endoscopist: Ladene Artist , MD Age: 35 Referring MD:  Date of Birth: 1986-06-14 Gender: Male Account #: [id number] Procedure:                Colonoscopy Indications:              Abdominal pain in the left lower quadrant, Change                            in bowel habits, Clinically significant diarrhea of                            unexplained origin Medicines:                Monitored Anesthesia Care Procedure:                Pre-Anesthesia Assessment:                           - Prior to the procedure, a History and Physical                            was performed, and patient medications and                            allergies were reviewed. The patient's tolerance of                            previous anesthesia was also reviewed. The risks                            and benefits of the procedure and the sedation                            options and risks were discussed with the patient.                            All questions were answered, and informed consent                            was obtained. Prior Anticoagulants: The patient has                            taken no previous anticoagulant or antiplatelet                            agents. ASA Grade Assessment: I - A normal, healthy                            patient. After reviewing the risks and benefits,                            the patient was deemed in satisfactory condition to  undergo the procedure.                           After obtaining informed consent, the colonoscope                            was passed under direct vision. Throughout the                            procedure, the patient's blood pressure, pulse, and                            oxygen saturations were monitored continuously. The                            Olympus CF-HQ190 [id number]) Colonoscope was                             introduced through the anus and advanced to the the                            cecum, identified by appendiceal orifice and                            ileocecal valve. The ileocecal valve, appendiceal                            orifice, and rectum were photographed. The quality                            of the bowel preparation was good. The colonoscopy                            was performed without difficulty. The patient                            tolerated the procedure well. Scope In: 8:35:36 AM Scope Out: 8:50:13 AM Scope Withdrawal Time: 0 hours 11 minutes 43 seconds  Total Procedure Duration: 0 hours 14 minutes 37 seconds  Findings:                 The perianal and digital rectal examinations were                            normal.                           A 6 mm polyp was found in the sigmoid colon. The                            polyp was sessile. The polyp was removed with a                            cold snare. Resection and retrieval were complete.  A few small-mouthed diverticula were found in the                            left colon. There was no evidence of diverticular                            bleeding. Biopsies were taken with a cold forceps                            for histology.                           Internal hemorrhoids were found during                            retroflexion. The hemorrhoids were small and Grade                            I (internal hemorrhoids that do not prolapse).                           The exam was otherwise without abnormality on                            direct and retroflexion views. Random biopsies                            obtained throughout. Complications:            No immediate complications. Estimated blood loss:                            None. Estimated Blood Loss:     Estimated blood loss: none. Impression:               - One 6 mm polyp in the sigmoid colon, removed  with                            a cold snare. Resected and retrieved.                           - Mild diverticulosis in the left colon. Biopsied.                           - Internal hemorrhoids.                           - The examination was otherwise normal on direct                            and retroflexion views. Biopsied. Recommendation:           - Repeat colonoscopy after studies are complete for                            surveillance based on pathology results.                           -  Patient has a contact number available for                            emergencies. The signs and symptoms of potential                            delayed complications were discussed with the                            patient. Return to normal activities tomorrow.                            Written discharge instructions were provided to the                            patient.                           - Resume previous diet.                           - Continue present medications.                           - Await pathology results.                           - Robinul 2 mg PO BID as needed, 1 year of refills.                           - Imodium OTC 1-2 PO TID prn diarrhea.                           - GI office appt in 6 weeks. Ladene Artist, MD 04/01/2021 8:58:45 AM This report has been signed electronically.

## 2021-04-01 NOTE — Progress Notes (Signed)
PT taken to PACU. Monitors in place. VSS. Report given to RN. 

## 2021-04-03 ENCOUNTER — Telehealth: Payer: Self-pay

## 2021-04-03 NOTE — Telephone Encounter (Signed)
  Follow up Call-  Call back number 04/01/2021  Post procedure Call Back phone  # [phone]  Permission to leave phone message Yes  Some recent data might be hidden     Patient questions:  Do you have a fever, pain , or abdominal swelling? No. Pain Score  0 *  Have you tolerated food without any problems? Yes.    Have you been able to return to your normal activities? Yes.    Do you have any questions about your discharge instructions: Diet   No. Medications  No. Follow up visit  No.  Do you have questions or concerns about your Care? No.  Actions: * If pain score is 4 or above: No action needed, pain <4.  Have you developed a fever since your procedure? no  2.   Have you had an respiratory symptoms (SOB or cough) since your procedure? no  3.   Have you tested positive for COVID 19 since your procedure no  4.   Have you had any family members/close contacts diagnosed with the COVID 19 since your procedure?  no   If yes to any of these questions please route to Joylene John, RN and Joella Prince, RN

## 2021-04-03 NOTE — Telephone Encounter (Signed)
  Follow up Call-  Call back number 04/01/2021  Post procedure Call Back phone  # [phone]  Permission to leave phone message Yes  Some recent data might be hidden    1st follow up call made.  NALM

## 2021-04-13 ENCOUNTER — Encounter: Payer: Self-pay | Admitting: Gastroenterology

## 2021-07-24 IMAGING — DX DG LUMBAR SPINE 2-3V
3 series · 3 of 3 positions shown · non-contrast
Comparison: None.

CLINICAL DATA: Low back pain with right-sided sciatica.  No injury.

EXAM:
LUMBAR SPINE - 2-3 VIEW

[l-spine ap]
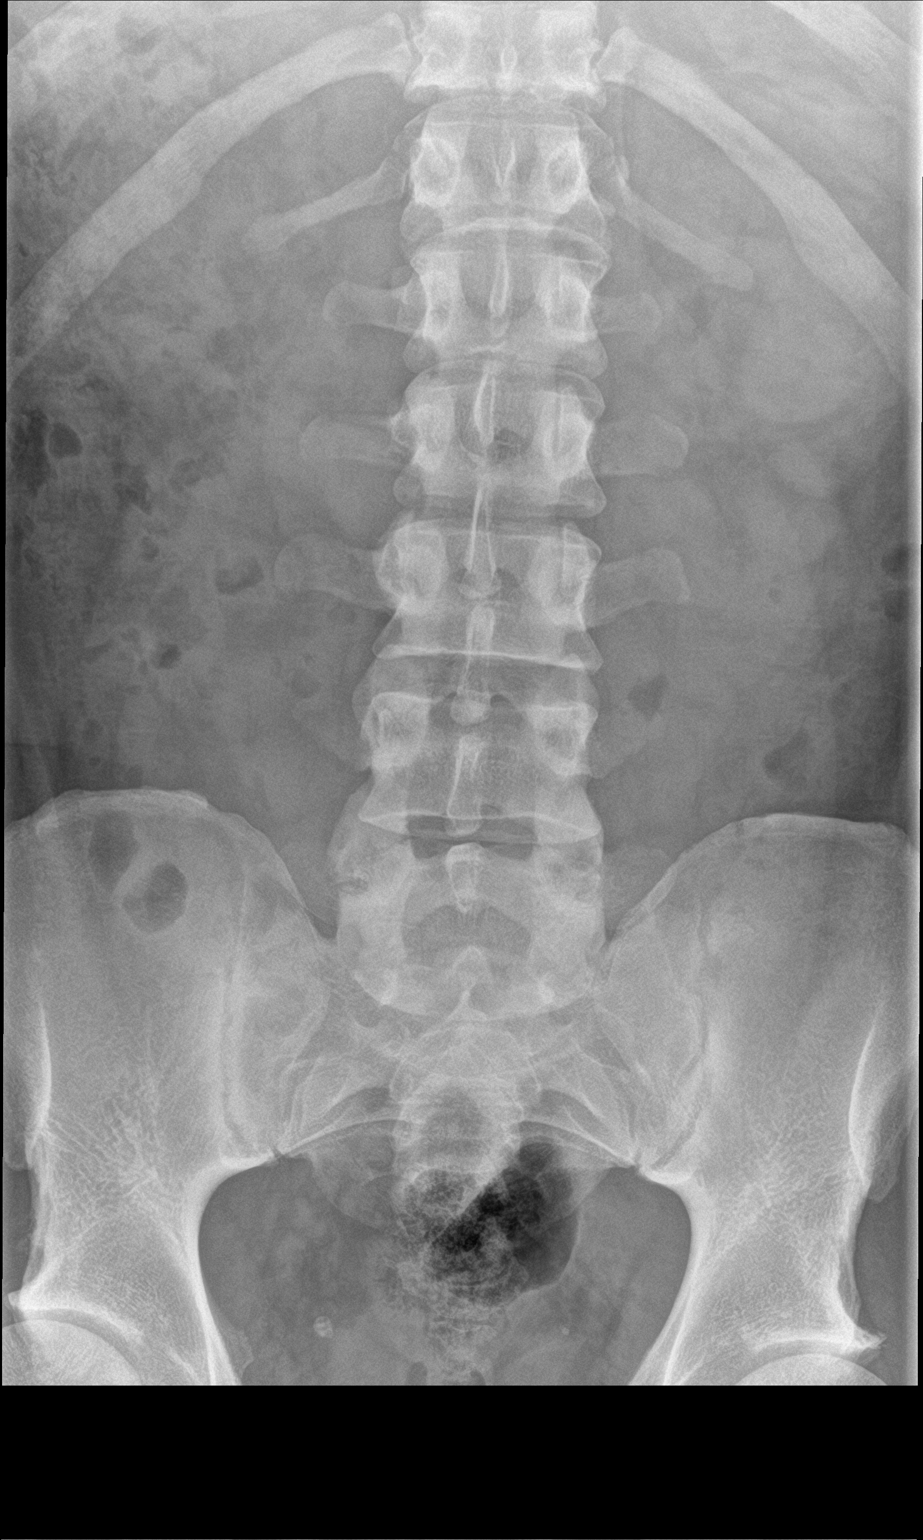

[l-spine lat]
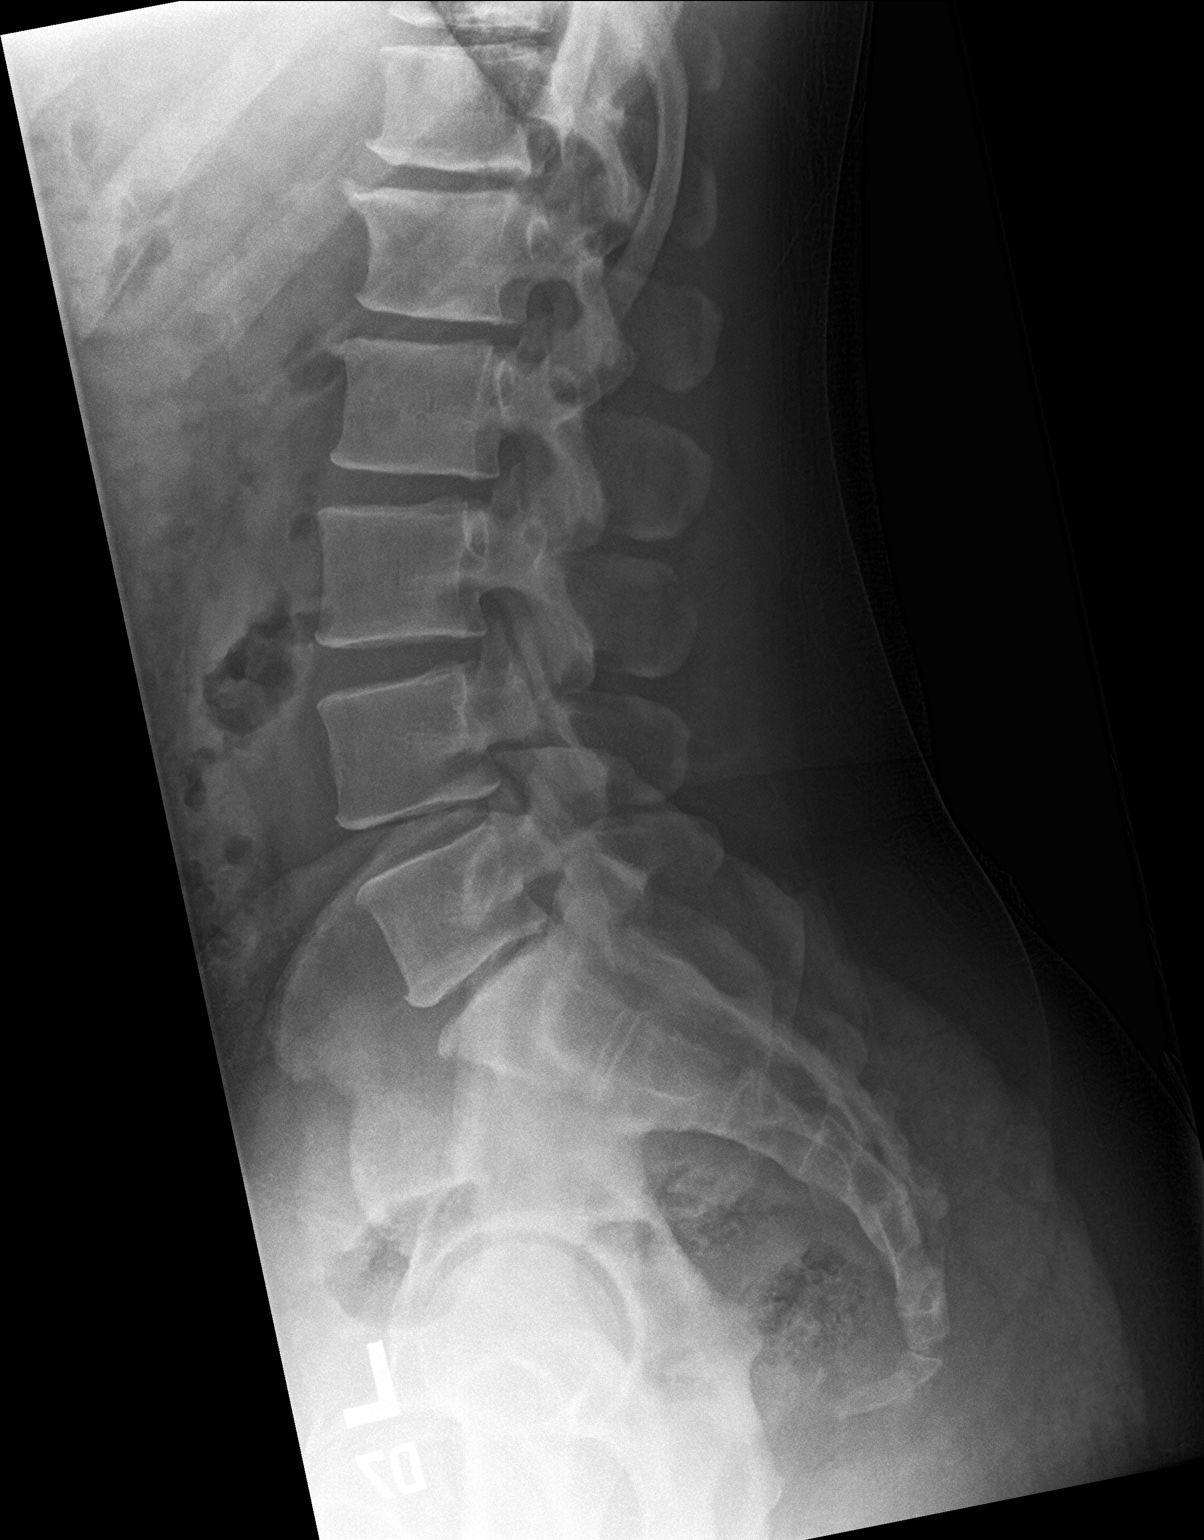

[l-spine spot]
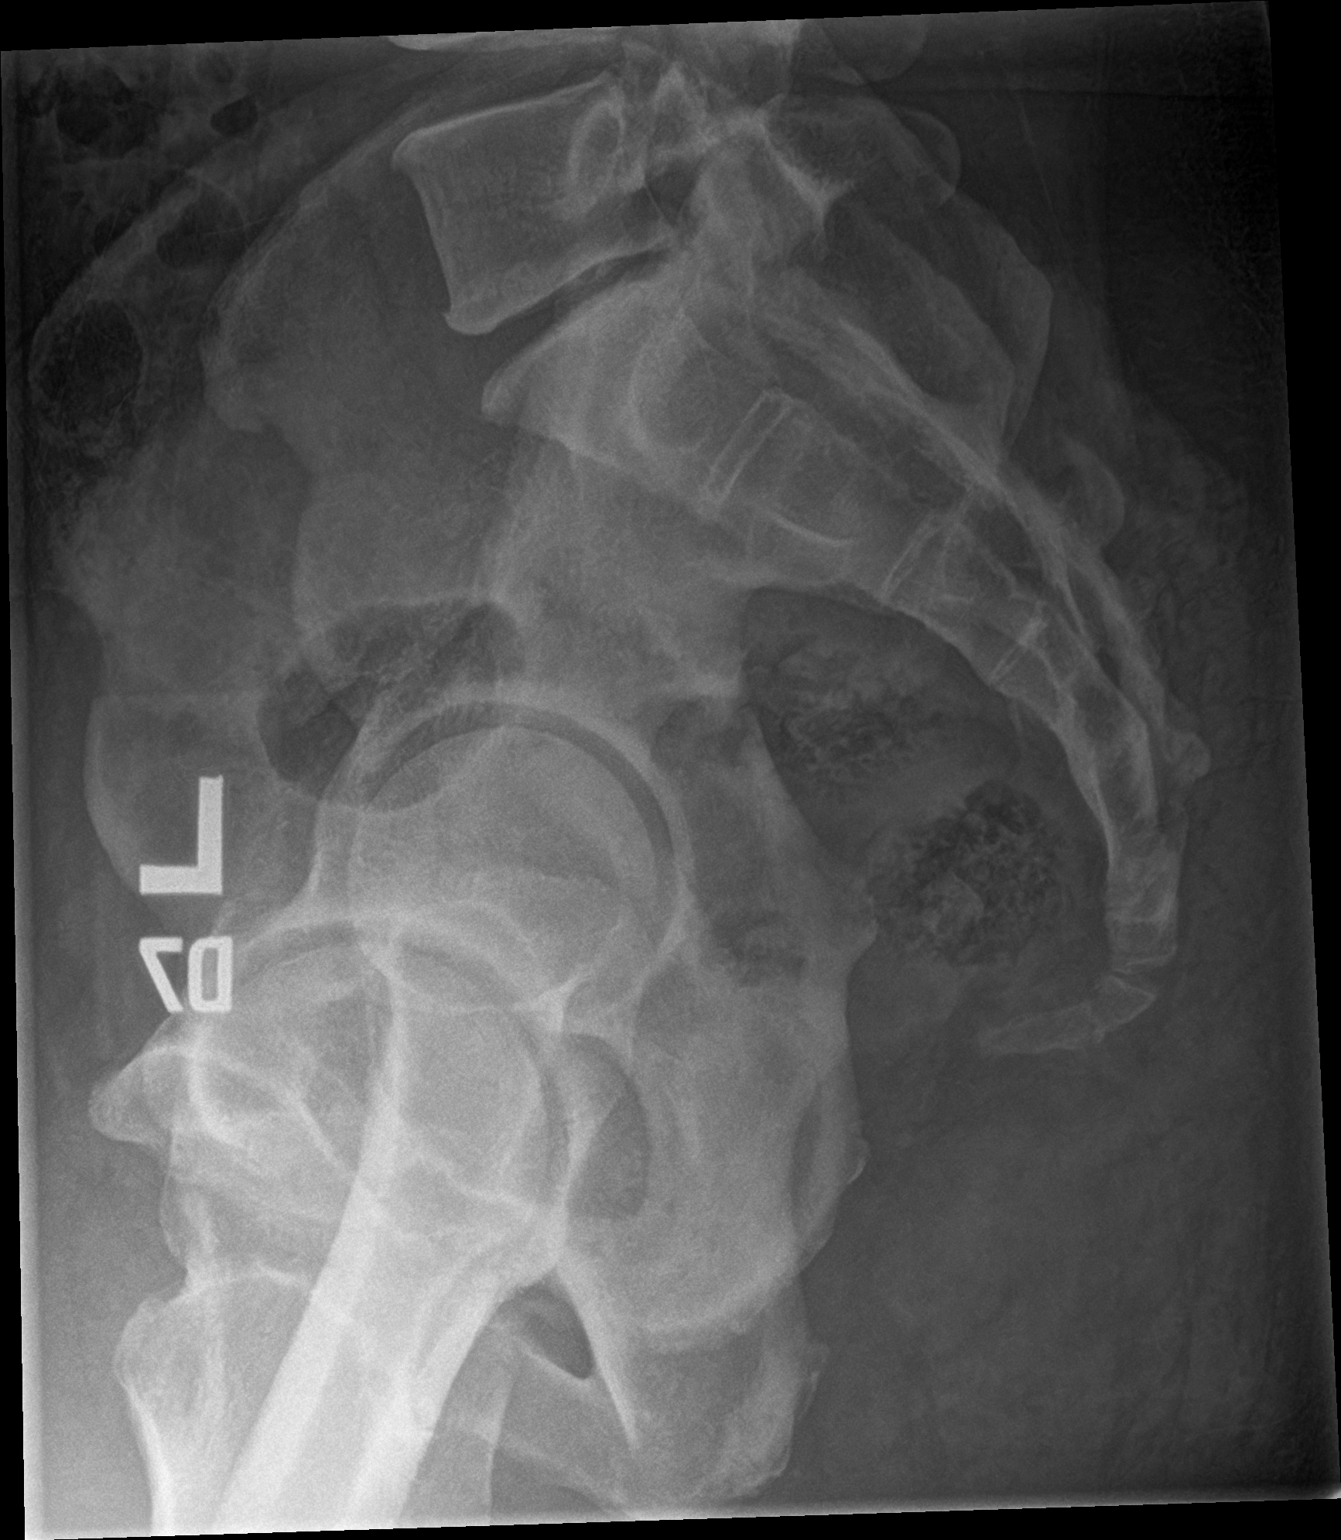

[3 of 3 positions shown; findings below may reference images not displayed]

FINDINGS: Alignment is anatomic. Vertebral body height is maintained. Mild
endplate degenerative changes are seen at T12-L1 and L1-2. Moderate
degenerative changes at L5-S1 with loss of disc space height.
IMPRESSION: Degenerative disc disease, worst at L5-S1.

## 2021-11-19 ENCOUNTER — Telehealth: Payer: Self-pay | Admitting: Cardiology

## 2021-11-19 NOTE — Telephone Encounter (Signed)
Called patient to schedule recall. He states he has moved out of state and does not need any further appointments.

## 2023-01-24 ENCOUNTER — Encounter: Payer: Self-pay | Admitting: *Deleted
